# Patient Record
Sex: Female | Born: 1990 | Race: Black or African American | Hispanic: No | Marital: Married | Smoking: Never smoker
Health system: Southern US, Community
[De-identification: ages and names within clinical notes are randomized; demographics above are authoritative.]

## PROBLEM LIST (undated history)

## (undated) DIAGNOSIS — I499 Cardiac arrhythmia, unspecified: Secondary | ICD-10-CM

## (undated) DIAGNOSIS — E559 Vitamin D deficiency, unspecified: Secondary | ICD-10-CM

## (undated) DIAGNOSIS — A599 Trichomoniasis, unspecified: Secondary | ICD-10-CM

## (undated) HISTORY — DX: Vitamin D deficiency, unspecified: E55.9

## (undated) HISTORY — DX: Trichomoniasis, unspecified: A59.9

## (undated) HISTORY — DX: Cardiac arrhythmia, unspecified: I49.9

---

## 2012-08-01 HISTORY — PX: DILATION AND CURETTAGE OF UTERUS: SHX78

## 2020-02-06 ENCOUNTER — Encounter (HOSPITAL_COMMUNITY): Payer: Self-pay

## 2020-02-06 ENCOUNTER — Inpatient Hospital Stay (HOSPITAL_COMMUNITY): Payer: Medicaid Other

## 2020-02-06 ENCOUNTER — Encounter (HOSPITAL_COMMUNITY): Payer: Self-pay | Admitting: Obstetrics and Gynecology

## 2020-02-06 ENCOUNTER — Ambulatory Visit (HOSPITAL_COMMUNITY)
Admission: EM | Admit: 2020-02-06 | Discharge: 2020-02-06 | Disposition: A | Discharge status: Home / Self Care | Payer: Medicaid Other | Attending: Family Medicine | Admitting: Family Medicine

## 2020-02-06 ENCOUNTER — Inpatient Hospital Stay (HOSPITAL_COMMUNITY)
Admission: AD | Admit: 2020-02-06 | Discharge: 2020-02-06 | Disposition: A | Discharge status: Home / Self Care | Payer: Medicaid Other | Attending: Obstetrics and Gynecology | Admitting: Obstetrics and Gynecology

## 2020-02-06 ENCOUNTER — Other Ambulatory Visit: Payer: Self-pay

## 2020-02-06 DIAGNOSIS — Z3201 Encounter for pregnancy test, result positive: Secondary | ICD-10-CM | POA: Diagnosis not present

## 2020-02-06 DIAGNOSIS — O26891 Other specified pregnancy related conditions, first trimester: Secondary | ICD-10-CM | POA: Insufficient documentation

## 2020-02-06 DIAGNOSIS — Z3A10 10 weeks gestation of pregnancy: Secondary | ICD-10-CM | POA: Insufficient documentation

## 2020-02-06 DIAGNOSIS — A599 Trichomoniasis, unspecified: Secondary | ICD-10-CM

## 2020-02-06 DIAGNOSIS — N939 Abnormal uterine and vaginal bleeding, unspecified: Secondary | ICD-10-CM

## 2020-02-06 DIAGNOSIS — R8271 Bacteriuria: Secondary | ICD-10-CM | POA: Insufficient documentation

## 2020-02-06 DIAGNOSIS — O98311 Other infections with a predominantly sexual mode of transmission complicating pregnancy, first trimester: Secondary | ICD-10-CM | POA: Diagnosis not present

## 2020-02-06 DIAGNOSIS — O209 Hemorrhage in early pregnancy, unspecified: Secondary | ICD-10-CM

## 2020-02-06 DIAGNOSIS — Z3A09 9 weeks gestation of pregnancy: Secondary | ICD-10-CM

## 2020-02-06 LAB — WET PREP, GENITAL
Clue Cells Wet Prep HPF POC: NONE SEEN
Sperm: NONE SEEN
Yeast Wet Prep HPF POC: NONE SEEN

## 2020-02-06 LAB — URINALYSIS, ROUTINE W REFLEX MICROSCOPIC
Bilirubin Urine: NEGATIVE
Glucose, UA: NEGATIVE mg/dL
Ketones, ur: NEGATIVE mg/dL
Nitrite: NEGATIVE
Protein, ur: NEGATIVE mg/dL
Specific Gravity, Urine: 1.014 (ref 1.005–1.030)
pH: 6 (ref 5.0–8.0)

## 2020-02-06 LAB — POCT URINALYSIS DIP (DEVICE)
Bilirubin Urine: NEGATIVE
Glucose, UA: NEGATIVE mg/dL
Ketones, ur: NEGATIVE mg/dL
Nitrite: NEGATIVE
Protein, ur: NEGATIVE mg/dL
Specific Gravity, Urine: 1.025 (ref 1.005–1.030)
Urobilinogen, UA: 0.2 mg/dL (ref 0.0–1.0)
pH: 6.5 (ref 5.0–8.0)

## 2020-02-06 LAB — HCG, QUANTITATIVE, PREGNANCY: hCG, Beta Chain, Quant, S: 236386 m[IU]/mL — ABNORMAL HIGH (ref <5)

## 2020-02-06 LAB — CBC
HCT: 37.1 % (ref 36.0–46.0)
Hemoglobin: 12.3 g/dL (ref 12.0–15.0)
MCH: 27.8 pg (ref 26.0–34.0)
MCHC: 33.2 g/dL (ref 30.0–36.0)
MCV: 83.7 fL (ref 80.0–100.0)
Platelets: 396 10*3/uL (ref 150–400)
RBC: 4.43 MIL/uL (ref 3.87–5.11)
RDW: 13.7 % (ref 11.5–15.5)
WBC: 6.7 10*3/uL (ref 4.0–10.5)
nRBC: 0 % (ref 0.0–0.2)

## 2020-02-06 LAB — POC URINE PREG, ED: Preg Test, Ur: POSITIVE — AB

## 2020-02-06 LAB — ABO/RH: ABO/RH(D): A POS

## 2020-02-06 MED ORDER — METRONIDAZOLE 500 MG PO TABS
2000.0000 mg | ORAL_TABLET | Freq: Once | ORAL | Status: AC
  Administered 2020-02-06: 2000 mg via ORAL
  Filled 2020-02-06: qty 4

## 2020-02-06 NOTE — MAU Note (Addendum)
Pt reports that she was having a BM @ 1030 last night and a spot of blood was on the tissue when she wiped.  Again this am around 0530 went to the bathroom, wiped again, and there was bloody show on the tissue.  Last intercourse was 4 days ago.  First prenatal appt scheduled for 02/18/20 at [facility].

## 2020-02-06 NOTE — ED Triage Notes (Signed)
Pt reports being [redacted]wks pregnant and began having some vaginal spotting last night around 1030pm. Pt reports after having BM, she noticed the blood. No bleeding noted today.

## 2020-02-06 NOTE — Discharge Instructions (Signed)
First Trimester of Pregnancy  The first trimester of pregnancy is from week 1 until the end of week 13 (months 1 through 3). During this time, your baby will begin to develop inside you. At 6-8 weeks, the eyes and face are formed, and the heartbeat can be seen on ultrasound. At the end of 12 weeks, all the baby's organs are formed. Prenatal care is all the medical care you receive before the birth of your baby. Make sure you get good prenatal care and follow all of your doctor's instructions. Follow these instructions at home: Medicines  Take over-the-counter and prescription medicines only as told by your doctor. Some medicines are safe and some medicines are not safe during pregnancy.  Take a prenatal vitamin that contains at least 600 micrograms (mcg) of folic acid.  If you have trouble pooping (constipation), take medicine that will make your stool soft (stool softener) if your doctor approves. Eating and drinking   Eat regular, healthy meals.  Your doctor will tell you the amount of weight gain that is right for you.  Avoid raw meat and uncooked cheese.  If you feel sick to your stomach (nauseous) or throw up (vomit): ? Eat 4 or 5 small meals a day instead of 3 large meals. ? Try eating a few soda crackers. ? Drink liquids between meals instead of during meals.  To prevent constipation: ? Eat foods that are high in fiber, like fresh fruits and vegetables, whole grains, and beans. ? Drink enough fluids to keep your pee (urine) clear or pale yellow. Activity  Exercise only as told by your doctor. Stop exercising if you have cramps or pain in your lower belly (abdomen) or low back.  Do not exercise if it is too hot, too humid, or if you are in a place of great height (high altitude).  Try to avoid standing for long periods of time. Move your legs often if you must stand in one place for a long time.  Avoid heavy lifting.  Wear low-heeled shoes. Sit and stand up  straight.  You can have sex unless your doctor tells you not to. Relieving pain and discomfort  Wear a good support bra if your breasts are sore.  Take warm water baths (sitz baths) to soothe pain or discomfort caused by hemorrhoids. Use hemorrhoid cream if your doctor says it is okay.  Rest with your legs raised if you have leg cramps or low back pain.  If you have puffy, bulging veins (varicose veins) in your legs: ? Wear support hose or compression stockings as told by your doctor. ? Raise (elevate) your feet for 15 minutes, 3-4 times a day. ? Limit salt in your food. Prenatal care  Schedule your prenatal visits by the twelfth week of pregnancy.  Write down your questions. Take them to your prenatal visits.  Keep all your prenatal visits as told by your doctor. This is important. Safety  Wear your seat belt at all times when driving.  Make a list of emergency phone numbers. The list should include numbers for family, friends, the hospital, and police and fire departments. General instructions  Ask your doctor for a referral to a local prenatal class. Begin classes no later than at the start of month 6 of your pregnancy.  Ask for help if you need counseling or if you need help with nutrition. Your doctor can give you advice or tell you where to go for help.  Do not use hot tubs, steam   rooms, or saunas.  Do not douche or use tampons or scented sanitary pads.  Do not cross your legs for long periods of time.  Avoid all herbs and alcohol. Avoid drugs that are not approved by your doctor.  Do not use any tobacco products, including cigarettes, chewing tobacco, and electronic cigarettes. If you need help quitting, ask your doctor. You may get counseling or other support to help you quit.  Avoid cat litter boxes and soil used by cats. These carry germs that can cause birth defects in the baby and can cause a loss of your baby (miscarriage) or stillbirth.  Visit your dentist.  At home, brush your teeth with a soft toothbrush. Be gentle when you floss. Contact a doctor if:  You are dizzy.  You have mild cramps or pressure in your lower belly.  You have a nagging pain in your belly area.  You continue to feel sick to your stomach, you throw up, or you have watery poop (diarrhea).  You have a bad smelling fluid coming from your vagina.  You have pain when you pee (urinate).  You have increased puffiness (swelling) in your face, hands, legs, or ankles. Get help right away if:  You have a fever.  You are leaking fluid from your vagina.  You have spotting or bleeding from your vagina.  You have very bad belly cramping or pain.  You gain or lose weight rapidly.  You throw up blood. It may look like coffee grounds.  You are around people who have German measles, fifth disease, or chickenpox.  You have a very bad headache.  You have shortness of breath.  You have any kind of trauma, such as from a fall or a car accident. Summary  The first trimester of pregnancy is from week 1 until the end of week 13 (months 1 through 3).  To take care of yourself and your unborn baby, you will need to eat healthy meals, take medicines only if your doctor tells you to do so, and do activities that are safe for you and your baby.  Keep all follow-up visits as told by your doctor. This is important as your doctor will have to ensure that your baby is healthy and growing well. This information is not intended to replace advice given to you by your health care provider. Make sure you discuss any questions you have with your health care provider. Document Revised: 11/08/2018 Document Reviewed: 07/26/2016 Elsevier Patient Education  2020 Elsevier Inc.  

## 2020-02-06 NOTE — ED Provider Notes (Signed)
[facility]   MRN: [id number] DOB: 08-31-1990  Subjective:   Cindy Hughes is a 29 y.o. female presenting for 1 day hx of light vaginal bleeding.  Still noticed some spotting this morning.  Patient is [redacted] weeks pregnant.  Denies fever, abdominal pain, pelvic pain, dysuria, urinary frequency.  Patient would like to make sure her pregnancy is safe.  No current facility-administered medications for this encounter.  Current Outpatient Medications:    Prenatal Vit-Fe Fumarate-FA (MULTIVITAMIN-PRENATAL) 27-0.8 MG TABS tablet, Take 1 tablet by mouth daily at 12 noon., Disp: , Rfl:    Not on File  History reviewed. No pertinent past medical history.   History reviewed. No pertinent surgical history.  History reviewed. No pertinent family history.  Social History   Tobacco Use   Smoking status: Never Smoker   Smokeless tobacco: Never Used  Substance Use Topics   Alcohol use: Never   Drug use: Never    ROS   Objective:   Vitals: BP 126/81 (BP Location: Left Arm)    Pulse 79    Temp 98.3 F (36.8 C) (Oral)    Resp 16    Ht 5\' 3"  (1.6 m)    Wt 250 lb (113.4 kg)    LMP 12/06/2019    SpO2 100%    BMI 44.29 kg/m   Physical Exam Constitutional:      General: She is not in acute distress.    Appearance: Normal appearance. She is well-developed. She is obese. She is not ill-appearing, toxic-appearing or diaphoretic.  HENT:     Head: Normocephalic and atraumatic.     Nose: Nose normal.     Mouth/Throat:     Mouth: Mucous membranes are moist.     Pharynx: Oropharynx is clear.  Eyes:     General: No scleral icterus.       Right eye: No discharge.        Left eye: No discharge.     Extraocular Movements: Extraocular movements intact.     Conjunctiva/sclera: Conjunctivae normal.     Pupils: Pupils are equal, round, and reactive to light.  Cardiovascular:     Rate and Rhythm: Normal rate.  Pulmonary:     Effort: Pulmonary effort is normal.  Skin:     General: Skin is warm and dry.  Neurological:     General: No focal deficit present.     Mental Status: She is alert and oriented to person, place, and time.  Psychiatric:        Mood and Affect: Mood normal.        Behavior: Behavior normal.        Thought Content: Thought content normal.        Judgment: Judgment normal.     Results for orders placed or performed during the hospital encounter of 02/06/20 (from the past 24 hour(s))  POC urine pregnancy     Status: Abnormal   Collection Time: 02/06/20  9:54 AM  Result Value Ref Range   Preg Test, Ur POSITIVE (A) NEGATIVE  POCT urinalysis dip (device)     Status: Abnormal   Collection Time: 02/06/20 10:16 AM  Result Value Ref Range   Glucose, UA NEGATIVE NEGATIVE mg/dL   Bilirubin Urine NEGATIVE NEGATIVE   Ketones, ur NEGATIVE NEGATIVE mg/dL   Specific Gravity, Urine 1.025 1.005 - 1.030   Hgb urine dipstick SMALL (A) NEGATIVE   pH 6.5 5.0 - 8.0   Protein, ur NEGATIVE NEGATIVE mg/dL   Urobilinogen, UA  0.2 0.0 - 1.0 mg/dL   Nitrite NEGATIVE NEGATIVE   Leukocytes,Ua LARGE (A) NEGATIVE    Assessment and Plan :   PDMP not reviewed this encounter.  1. Vaginal bleeding   2. [redacted] weeks gestation of pregnancy     Discussed urinalysis with patient including the small hematuria, large leukocytes.  In the absence of symptoms consistent with an UTI and patient's concern for her pregnancy recommended evaluation in the maternal admission unit.  Patient is agreeable to present there now.   Wallis Bamberg, PA-C 02/06/20 1020

## 2020-02-06 NOTE — MAU Provider Note (Addendum)
History     CSN: [id number]  Arrival date and time: 02/06/20 1032   First Provider Initiated Contact with Patient 02/06/20 1139      Chief Complaint  Patient presents with  . Vaginal Bleeding   HPI Cindy Hughes is a 29 y.o. [phone] at [redacted]w[redacted]d who presents to MAU with chief complaint of vaginal bleeding. This is a new problem, onset last night. Patient states she had a bowel movement and noticed bleeding when she wiped. She then observed pink-tinged discharge when she wiped after voiding this morning. She denies abdominal pain, tenderness, dysuria, fever or recent illness. Most recent sexual intercourse 3-4 days ago.  Patient is scheduled for a New OB appointment with [facility] on 02/18/2020.  OB History    Gravida  4   Para  1   Term  1   Preterm      AB  2   Living  1     SAB  1   TAB  1   Ectopic      Multiple      Live Births  1           History reviewed. No pertinent past medical history.  Past Surgical History:  Procedure Laterality Date  . DILATION AND CURETTAGE OF UTERUS  2014    Family History  Adopted: Yes    Social History   Tobacco Use  . Smoking status: Never Smoker  . Smokeless tobacco: Never Used  Vaping Use  . Vaping Use: Never used  Substance Use Topics  . Alcohol use: Never  . Drug use: Never    Allergies: No Known Allergies  No medications prior to admission.    Review of Systems  Gastrointestinal: Negative for abdominal pain.  Genitourinary: Positive for vaginal bleeding. Negative for dysuria.  Musculoskeletal: Negative for back pain.  All other systems reviewed and are negative.  Physical Exam   Blood pressure 132/83, pulse 92, temperature 98.3 F (36.8 C), temperature source Oral, resp. rate 18, weight 113.4 kg, last menstrual period 12/06/2019, SpO2 100 %.  Physical Exam Vitals and nursing note reviewed. Exam conducted with a chaperone present.  Cardiovascular:     Rate and Rhythm: Normal rate.      Pulses: Normal pulses.     Heart sounds: Normal heart sounds.  Pulmonary:     Effort: Pulmonary effort is normal.     Breath sounds: Normal breath sounds.  Abdominal:     General: Bowel sounds are normal.  Genitourinary:    Comments: Pelvic exam: External genitalia normal, vaginal walls pink and well rugated, cervix visually closed, no lesions noted. Small amount of green-tinged mucus proximal to cervical os. No bleeding noted. No CMT on bimanual. No hemorrhoids observed.  Neurological:     Mental Status: She is alert.     MAU Course/MDM  Procedures    Patient Vitals for the past 24 hrs:  BP Temp Temp src Pulse Resp SpO2 Weight  02/06/20 1358 132/83 98.3 F (36.8 C) Oral 92 18 100 % --  02/06/20 1119 130/68 98.4 F (36.9 C) Oral 74 18 98 % 113.4 kg   Orders Placed This Encounter  Procedures  . Wet prep, genital  . US OB LESS THAN 14 WEEKS WITH OB TRANSVAGINAL  . CBC  . hCG, quantitative, pregnancy  . Urinalysis, Routine w reflex microscopic  . ABO/Rh   Meds ordered this encounter  Medications  . metroNIDAZOLE (FLAGYL) tablet 2,000 mg   Results for orders placed  or performed during the hospital encounter of 02/06/20 (from the past 24 hour(s))  CBC     Status: None   Collection Time: 02/06/20 11:09 AM  Result Value Ref Range   WBC 6.7 4.0 - 10.5 K/uL   RBC 4.43 3.87 - 5.11 MIL/uL   Hemoglobin 12.3 12.0 - 15.0 g/dL   HCT 40.9 36 - 46 %   MCV 83.7 80.0 - 100.0 fL   MCH 27.8 26.0 - 34.0 pg   MCHC 33.2 30.0 - 36.0 g/dL   RDW 81.1 91.4 - 78.2 %   Platelets 396 150 - 400 K/uL   nRBC 0.0 0.0 - 0.2 %  hCG, quantitative, pregnancy     Status: Abnormal   Collection Time: 02/06/20 11:09 AM  Result Value Ref Range   hCG, Beta Chain, Quant, S 956,213 (H) <5 mIU/mL  ABO/Rh     Status: None   Collection Time: 02/06/20 11:09 AM  Result Value Ref Range   ABO/RH(D) A POS    No rh immune globuloin      NOT A RH IMMUNE GLOBULIN CANDIDATE, PT RH POSITIVE Performed at [facility], [address], [place], [place] [address]   Urinalysis, Routine w reflex microscopic     Status: Abnormal   Collection Time: 02/06/20 11:41 AM  Result Value Ref Range   Color, Urine YELLOW YELLOW   APPearance CLOUDY (A) CLEAR   Specific Gravity, Urine 1.014 1.005 - 1.030   pH 6.0 5.0 - 8.0   Glucose, UA NEGATIVE NEGATIVE mg/dL   Hgb urine dipstick SMALL (A) NEGATIVE   Bilirubin Urine NEGATIVE NEGATIVE   Ketones, ur NEGATIVE NEGATIVE mg/dL   Protein, ur NEGATIVE NEGATIVE mg/dL   Nitrite NEGATIVE NEGATIVE   Leukocytes,Ua LARGE (A) NEGATIVE   RBC / HPF 21-50 0 - 5 RBC/hpf   WBC, UA 21-50 0 - 5 WBC/hpf   Bacteria, UA MANY (A) NONE SEEN   Squamous Epithelial / LPF 11-20 0 - 5  Wet prep, genital     Status: Abnormal   Collection Time: 02/06/20 11:47 AM   Specimen: Vaginal  Result Value Ref Range   Yeast Wet Prep HPF POC NONE SEEN NONE SEEN   Trich, Wet Prep PRESENT (A) NONE SEEN   Clue Cells Wet Prep HPF POC NONE SEEN NONE SEEN   WBC, Wet Prep HPF POC MANY (A) NONE SEEN   Sperm NONE SEEN     US OB LESS THAN 14 WEEKS WITH OB TRANSVAGINAL  Result Date: 02/06/2020 CLINICAL DATA:  Vaginal bleeding in first trimester of pregnancy, spotting last night, LMP 11/30/2019 EXAM: OBSTETRIC <14 WK [place] AND TRANSVAGINAL OB US TECHNIQUE: Both transabdominal and transvaginal ultrasound examinations were performed for complete evaluation of the gestation as well as the maternal uterus, adnexal regions, and pelvic cul-de-sac. Transvaginal technique was performed to assess early pregnancy. COMPARISON:  None FINDINGS: Intrauterine gestational sac: Present, single Yolk sac:  Present Embryo:  Present Cardiac Activity: Present Heart Rate: 160 bpm CRL:  32.0 mm   10 w   1 d                  [place] EDC: 09/02/2020 Subchorionic hemorrhage:  None visualized. Maternal uterus/adnexae: Uterus anteverted, grossly unremarkable. Developing placenta appears posterior RIGHT. LEFT ovary normal size and morphology 3.8 x 1.9  x 2.4 cm. RIGHT ovary normal size and morphology 2.2 x 1.2 x 1.4 cm. No free pelvic fluid or adnexal masses. IMPRESSION: Single live intrauterine gestation at  10 weeks 1 day EGA by crown-rump length. No acute abnormalities. Electronically Signed   By: Ulyses Southward M.D.   On: 02/06/2020 12:54   Assessment and Plan  --29 y.o. G4P1021 at [redacted]w[redacted]d by US performed today --Abnormal UA, urine culture ordered --Blood type A POS, Rhogam not indicated --+ Trichomonas, treated in MAU --Given paper rx for expedited partner treatment --Reviewed recommendations for close proximity treatment for both partners, abstinence until 7 days s/p treatment, condom use for remainder of pregnancy --Discharge home in stable condition  F/U: --Has New OB appt with [facility] 02/18/2020  Calvert Cantor, CNM 02/06/2020, 4:10 PM

## 2020-02-07 LAB — GC/CHLAMYDIA PROBE AMP (~~LOC~~) NOT AT ARMC
Chlamydia: NEGATIVE
Comment: NEGATIVE
Comment: NORMAL
Neisseria Gonorrhea: NEGATIVE

## 2020-02-18 LAB — OB RESULTS CONSOLE GC/CHLAMYDIA: Gonorrhea: NEGATIVE

## 2020-02-18 LAB — OB RESULTS CONSOLE HEPATITIS B SURFACE ANTIGEN: Hepatitis B Surface Ag: NEGATIVE

## 2020-02-18 LAB — OB RESULTS CONSOLE HIV ANTIBODY (ROUTINE TESTING): HIV: NONREACTIVE

## 2020-02-18 LAB — OB RESULTS CONSOLE RUBELLA ANTIBODY, IGM: Rubella: IMMUNE

## 2020-03-20 ENCOUNTER — Emergency Department (HOSPITAL_COMMUNITY): Payer: Medicaid Other

## 2020-03-20 ENCOUNTER — Encounter (HOSPITAL_COMMUNITY): Payer: Self-pay | Admitting: Emergency Medicine

## 2020-03-20 ENCOUNTER — Other Ambulatory Visit: Payer: Self-pay

## 2020-03-20 ENCOUNTER — Emergency Department (HOSPITAL_COMMUNITY)
Admission: EM | Admit: 2020-03-20 | Discharge: 2020-03-20 | Disposition: A | Discharge status: Home / Self Care | Payer: Medicaid Other | Attending: Emergency Medicine | Admitting: Emergency Medicine

## 2020-03-20 DIAGNOSIS — W19XXXA Unspecified fall, initial encounter: Secondary | ICD-10-CM

## 2020-03-20 DIAGNOSIS — W1842XA Slipping, tripping and stumbling without falling due to stepping into hole or opening, initial encounter: Secondary | ICD-10-CM | POA: Diagnosis not present

## 2020-03-20 DIAGNOSIS — S93602A Unspecified sprain of left foot, initial encounter: Secondary | ICD-10-CM | POA: Diagnosis not present

## 2020-03-20 DIAGNOSIS — Y929 Unspecified place or not applicable: Secondary | ICD-10-CM | POA: Diagnosis not present

## 2020-03-20 DIAGNOSIS — Y999 Unspecified external cause status: Secondary | ICD-10-CM | POA: Insufficient documentation

## 2020-03-20 DIAGNOSIS — Y939 Activity, unspecified: Secondary | ICD-10-CM | POA: Insufficient documentation

## 2020-03-20 DIAGNOSIS — S99922A Unspecified injury of left foot, initial encounter: Secondary | ICD-10-CM | POA: Diagnosis present

## 2020-03-20 NOTE — Progress Notes (Signed)
Orthopedic Tech Progress Note Patient Details:  Cindy Hughes 1991-07-20 [id number]  Ortho Devices Type of Ortho Device: Crutches Ortho Device/Splint Interventions: Application   Post Interventions Patient Tolerated: Well, Ambulated well   Saul Fordyce 03/20/2020, 1:26 PM

## 2020-03-20 NOTE — ED Provider Notes (Signed)
[facility]-EMERGENCY DEPT Provider Note   CSN: [id number] Arrival date & time: 03/20/20  0901     History Chief Complaint  Patient presents with  . Foot Pain    Cindy Hughes is a 29 y.o. female.  Pain in left foot after rolling it yesterday on a storm drain.  Has been ambulatory with pain to the top of her left foot.  No ankle pain or knee pain.  The history is provided by the patient.  Foot Pain This is a new problem. The current episode started yesterday. The problem occurs constantly. The problem has not changed since onset.The symptoms are aggravated by walking. Nothing relieves the symptoms. She has tried nothing for the symptoms. The treatment provided no relief.       History reviewed. No pertinent past medical history.  There are no problems to display for this patient.   Past Surgical History:  Procedure Laterality Date  . DILATION AND CURETTAGE OF UTERUS  2014     OB History    Gravida  4   Para  1   Term  1   Preterm      AB  2   Living  1     SAB  1   TAB  1   Ectopic      Multiple      Live Births  1           Family History  Adopted: Yes    Social History   Tobacco Use  . Smoking status: Never Smoker  . Smokeless tobacco: Never Used  Vaping Use  . Vaping Use: Never used  Substance Use Topics  . Alcohol use: Never  . Drug use: Never    Home Medications Prior to Admission medications   Medication Sig Start Date End Date Taking? Authorizing Provider  Prenatal Vit-Fe Fumarate-FA (MULTIVITAMIN-PRENATAL) 27-0.8 MG TABS tablet Take 1 tablet by mouth daily at 12 noon.    [provider]    Allergies    Patient has no known allergies.  Review of Systems   Review of Systems  Musculoskeletal: Positive for arthralgias. Negative for joint swelling.  Skin: Negative for color change, pallor, rash and wound.    Physical Exam Updated Vital Signs  ED Triage Vitals  Enc Vitals Group      BP 03/20/20 0914 139/81     Pulse Rate 03/20/20 0914 91     Resp 03/20/20 0914 18     Temp 03/20/20 0914 98.2 F (36.8 C)     Temp Source 03/20/20 0914 Oral     SpO2 03/20/20 0914 96 %     Weight --      Height --      Head Circumference --      Peak Flow --      Pain Score 03/20/20 0917 7     Pain Loc --      Pain Edu? --      Excl. in GC? --     Physical Exam Constitutional:      General: She is not in acute distress.    Appearance: She is not ill-appearing.  Cardiovascular:     Pulses: Normal pulses.  Musculoskeletal:        General: Tenderness (TTP to lateral portion of the left foot) present. No swelling or deformity. Normal range of motion.  Skin:    General: Skin is warm.     Capillary Refill: Capillary refill takes less than  2 seconds.  Neurological:     Mental Status: She is alert.     Sensory: No sensory deficit.     Motor: No weakness.     ED Results / Procedures / Treatments   Labs (all labs ordered are listed, but only abnormal results are displayed) Labs Reviewed - No data to display  EKG None  Radiology DG Foot Complete Left  Result Date: 03/20/2020 CLINICAL DATA:  Dorsal and lateral left foot pain after twisting injury EXAM: LEFT FOOT - COMPLETE 3+ VIEW COMPARISON:  None. FINDINGS: There is no evidence of fracture or dislocation. There is no evidence of arthropathy or other focal bone abnormality. Soft tissues are unremarkable. IMPRESSION: Negative. Electronically Signed   By: Duanne Guess D.O.   On: 03/20/2020 09:42    Procedures Procedures (including critical care time)  Medications Ordered in ED Medications - No data to display  ED Course  I have reviewed the triage vital signs and the nursing notes.  Pertinent labs & imaging results that were available during my care of the patient were reviewed by me and considered in my medical decision making (see chart for details).    MDM Rules/Calculators/A&P                           Cindy Hughes is a 29 year old female who presents to the ED with left foot pain.  Patient with normal vitals.  No fever.  Patient twisted her left foot yesterday on a storm drain.  No ankle pain.  Has been ambulatory since.  Mostly tender to the top of the lateral portion of the left foot.  Neurovascularly neuromuscularly intact.  X-ray negative for fracture.  Overall suspect bone contusion.  Recommend Tylenol, Motrin, ice.  Given crutches.  Discharged in good condition.  Understands return precautions.  This chart was dictated using voice recognition software.  Despite best efforts to proofread,  errors can occur which can change the documentation meaning.    Final Clinical Impression(s) / ED Diagnoses Final diagnoses:  Fall  Sprain of left foot, initial encounter    Rx / DC Orders ED Discharge Orders    None       Virgina Norfolk, DO 03/20/20 1322

## 2020-03-20 NOTE — ED Triage Notes (Signed)
Per pt, states she fell yesterday injuring top of left foot-increased pain with ambulation

## 2020-05-31 LAB — OB RESULTS CONSOLE GC/CHLAMYDIA
Chlamydia: NEGATIVE
Gonorrhea: NEGATIVE

## 2020-08-01 NOTE — L&D Delivery Note (Signed)
Delivery Note Labor onset:09/02/20   Labor Onset Time: last night Complete dilation at  1210 Onset of pushing at 1210 FHR second stage Cat 2 Analgesia/Anesthesia intrapartum: N/A  Guided pushing with maternal urge. Delivery of a viable female at 92. Fetal head delivered in ROA position. Nuchal cord x1, reduced. Infant placed on maternal abd, dried, and tactile stimulated with spontaneous cry. Cord double clamped after pulsation stopped and cut by FOB. Nsy present for birth. Delivered in MAU d/t no available beds on L&D. Cord blood sample collected.  Placenta delivered Tomasa Blase via Binnie Kand Maneuver, intact, with 3 VC.  Placenta to L&D. Uterine tone firm,  bleeding minimal  Right labial laceration identified, hemostatic Anesthesia: N/A Repair: none QBL/EBL (mL): 50 Complications: nuchal cord x 1 APGAR: APGAR (1 MIN):   APGAR (5 MINS):   APGAR (10 MINS):   Mom to postpartum.  Baby to Couplet care / Skin to Skin  Dr Philippa Sicks aware of delivery.  Gerhard Munch Dorrine Montone MSN, CNM 09/03/2020, 12:54 PM

## 2020-08-19 LAB — OB RESULTS CONSOLE GBS: GBS: NEGATIVE

## 2020-08-26 ENCOUNTER — Other Ambulatory Visit: Payer: Self-pay | Admitting: Obstetrics & Gynecology

## 2020-09-03 ENCOUNTER — Inpatient Hospital Stay (HOSPITAL_COMMUNITY)
Admission: AD | Admit: 2020-09-03 | Discharge: 2020-09-05 | DRG: 807 | Disposition: A | Discharge status: Home / Self Care | Payer: Medicaid Other | Attending: Obstetrics & Gynecology | Admitting: Obstetrics & Gynecology

## 2020-09-03 ENCOUNTER — Other Ambulatory Visit: Payer: Self-pay

## 2020-09-03 ENCOUNTER — Encounter (HOSPITAL_COMMUNITY): Payer: Self-pay | Admitting: Obstetrics & Gynecology

## 2020-09-03 ENCOUNTER — Telehealth (HOSPITAL_COMMUNITY): Payer: Self-pay | Admitting: *Deleted

## 2020-09-03 DIAGNOSIS — O365930 Maternal care for other known or suspected poor fetal growth, third trimester, not applicable or unspecified: Principal | ICD-10-CM | POA: Diagnosis present

## 2020-09-03 DIAGNOSIS — O99214 Obesity complicating childbirth: Secondary | ICD-10-CM | POA: Diagnosis present

## 2020-09-03 DIAGNOSIS — Z3A40 40 weeks gestation of pregnancy: Secondary | ICD-10-CM | POA: Diagnosis not present

## 2020-09-03 DIAGNOSIS — O26893 Other specified pregnancy related conditions, third trimester: Secondary | ICD-10-CM | POA: Diagnosis present

## 2020-09-03 DIAGNOSIS — Z20822 Contact with and (suspected) exposure to covid-19: Secondary | ICD-10-CM | POA: Diagnosis present

## 2020-09-03 LAB — CBC
HCT: 31.4 % — ABNORMAL LOW (ref 36.0–46.0)
Hemoglobin: 10.2 g/dL — ABNORMAL LOW (ref 12.0–15.0)
MCH: 28.4 pg (ref 26.0–34.0)
MCHC: 32.5 g/dL (ref 30.0–36.0)
MCV: 87.5 fL (ref 80.0–100.0)
Platelets: 357 10*3/uL (ref 150–400)
RBC: 3.59 MIL/uL — ABNORMAL LOW (ref 3.87–5.11)
RDW: 16.9 % — ABNORMAL HIGH (ref 11.5–15.5)
WBC: 11.2 10*3/uL — ABNORMAL HIGH (ref 4.0–10.5)
nRBC: 0 % (ref 0.0–0.2)

## 2020-09-03 LAB — TYPE AND SCREEN
ABO/RH(D): A POS
Antibody Screen: NEGATIVE

## 2020-09-03 LAB — SARS CORONAVIRUS 2 BY RT PCR (HOSPITAL ORDER, PERFORMED IN ~~LOC~~ HOSPITAL LAB): SARS Coronavirus 2: NEGATIVE

## 2020-09-03 MED ORDER — ACETAMINOPHEN 325 MG PO TABS: 650.0000 mg | ORAL_TABLET | ORAL | Status: DC | PRN

## 2020-09-03 MED ORDER — LACTATED RINGERS IV SOLN: INTRAVENOUS | Status: DC

## 2020-09-03 MED ORDER — FENTANYL CITRATE (PF) 100 MCG/2ML IJ SOLN
100.0000 ug | Freq: Once | INTRAMUSCULAR | Status: AC
  Administered 2020-09-03: 100 ug via INTRAVENOUS
  Filled 2020-09-03: qty 2

## 2020-09-03 MED ORDER — SENNOSIDES-DOCUSATE SODIUM 8.6-50 MG PO TABS
2.0000 | ORAL_TABLET | Freq: Every day | ORAL | Status: DC
  Administered 2020-09-04 – 2020-09-05 (×2): 2 via ORAL
  Filled 2020-09-03: qty 2

## 2020-09-03 MED ORDER — LIDOCAINE HCL (PF) 1 % IJ SOLN: 30.0000 mL | INTRAMUSCULAR | Status: DC | PRN

## 2020-09-03 MED ORDER — ONDANSETRON HCL 4 MG PO TABS: 4.0000 mg | ORAL_TABLET | ORAL | Status: DC | PRN

## 2020-09-03 MED ORDER — ACETAMINOPHEN 325 MG PO TABS
650.0000 mg | ORAL_TABLET | ORAL | Status: DC | PRN
  Administered 2020-09-03: 650 mg via ORAL
  Filled 2020-09-03: qty 2

## 2020-09-03 MED ORDER — ERYTHROMYCIN 5 MG/GM OP OINT
TOPICAL_OINTMENT | OPHTHALMIC | Status: AC
  Filled 2020-09-03: qty 1

## 2020-09-03 MED ORDER — LACTATED RINGERS IV SOLN: 500.0000 mL | INTRAVENOUS | Status: DC | PRN

## 2020-09-03 MED ORDER — OXYCODONE HCL 5 MG PO TABS
10.0000 mg | ORAL_TABLET | ORAL | Status: DC | PRN
Start: 2020-09-03 — End: 2020-09-05

## 2020-09-03 MED ORDER — OXYTOCIN BOLUS FROM INFUSION: 333.0000 mL | Freq: Once | INTRAVENOUS | Status: DC

## 2020-09-03 MED ORDER — FENTANYL CITRATE (PF) 100 MCG/2ML IJ SOLN: 50.0000 ug | INTRAMUSCULAR | Status: DC | PRN

## 2020-09-03 MED ORDER — PRENATAL MULTIVITAMIN CH
1.0000 | ORAL_TABLET | Freq: Every day | ORAL | Status: DC
  Administered 2020-09-04 – 2020-09-05 (×2): 1 via ORAL
  Filled 2020-09-03 (×2): qty 1

## 2020-09-03 MED ORDER — ONDANSETRON HCL 4 MG/2ML IJ SOLN: 4.0000 mg | INTRAMUSCULAR | Status: DC | PRN

## 2020-09-03 MED ORDER — SIMETHICONE 80 MG PO CHEW: 80.0000 mg | CHEWABLE_TABLET | ORAL | Status: DC | PRN

## 2020-09-03 MED ORDER — DIPHENHYDRAMINE HCL 25 MG PO CAPS: 25.0000 mg | ORAL_CAPSULE | Freq: Four times a day (QID) | ORAL | Status: DC | PRN

## 2020-09-03 MED ORDER — IBUPROFEN 600 MG PO TABS
600.0000 mg | ORAL_TABLET | Freq: Four times a day (QID) | ORAL | Status: DC
  Administered 2020-09-03 – 2020-09-05 (×8): 600 mg via ORAL
  Filled 2020-09-03 (×8): qty 1

## 2020-09-03 MED ORDER — OXYTOCIN 10 UNIT/ML IJ SOLN
10.0000 [IU] | Freq: Once | INTRAMUSCULAR | Status: AC
  Administered 2020-09-03: 10 [IU] via INTRAMUSCULAR

## 2020-09-03 MED ORDER — VITAMIN K1 1 MG/0.5ML IJ SOLN
INTRAMUSCULAR | Status: AC
  Filled 2020-09-03: qty 0.5

## 2020-09-03 MED ORDER — DIBUCAINE (PERIANAL) 1 % EX OINT: 1.0000 "application " | TOPICAL_OINTMENT | CUTANEOUS | Status: DC | PRN

## 2020-09-03 MED ORDER — COCONUT OIL OIL: 1.0000 "application " | TOPICAL_OIL | Status: DC | PRN

## 2020-09-03 MED ORDER — ZOLPIDEM TARTRATE 5 MG PO TABS: 5.0000 mg | ORAL_TABLET | Freq: Every evening | ORAL | Status: DC | PRN

## 2020-09-03 MED ORDER — WITCH HAZEL-GLYCERIN EX PADS: 1.0000 "application " | MEDICATED_PAD | CUTANEOUS | Status: DC | PRN

## 2020-09-03 MED ORDER — OXYCODONE HCL 5 MG PO TABS
5.0000 mg | ORAL_TABLET | ORAL | Status: DC | PRN
Start: 2020-09-03 — End: 2020-09-05

## 2020-09-03 MED ORDER — OXYTOCIN-SODIUM CHLORIDE 30-0.9 UT/500ML-% IV SOLN
2.5000 [IU]/h | INTRAVENOUS | Status: DC
  Filled 2020-09-03: qty 500

## 2020-09-03 MED ORDER — BENZOCAINE-MENTHOL 20-0.5 % EX AERO: 1.0000 "application " | INHALATION_SPRAY | CUTANEOUS | Status: DC | PRN

## 2020-09-03 MED ORDER — ONDANSETRON HCL 4 MG/2ML IJ SOLN: 4.0000 mg | Freq: Four times a day (QID) | INTRAMUSCULAR | Status: DC | PRN

## 2020-09-03 MED ORDER — TETANUS-DIPHTH-ACELL PERTUSSIS 5-2.5-18.5 LF-MCG/0.5 IM SUSY: 0.5000 mL | PREFILLED_SYRINGE | Freq: Once | INTRAMUSCULAR | Status: DC

## 2020-09-03 MED ORDER — SOD CITRATE-CITRIC ACID 500-334 MG/5ML PO SOLN: 30.0000 mL | ORAL | Status: DC | PRN

## 2020-09-03 NOTE — Telephone Encounter (Signed)
Preadmission screen  

## 2020-09-03 NOTE — H&P (Signed)
OB ADMISSION/ HISTORY & PHYSICAL:  Admission Date: 09/03/2020  9:38 AM  Admit Diagnosis: spontaneous labor  Cindy Hughes is a 30 y.o. female [phone] [redacted]w[redacted]d presenting for active labor. Endorses active FM, denies LOF and vaginal bleeding. Ctx began last night.   History of current pregnancy: [id number]   Primary OB Provider: [facility] Patient entered care with [facility] at 10.1 wks.   EDC 09/08/20 by LMP and congruent w/ 10.1 wk U/S.   Anatomy scan:  20 wks, complete w/ posterior placenta.   Antenatal testing: for BMI >40 started at 32 weeks Last evaluation: 39.1  wks  Significant prenatal events: SGA, obesity  Prenatal Labs: ABO, Rh: --/--/A POS (07/08 1109) Antibody:  negative Rubella: Immune (07/20 0000)  RPR:   NR HBsAg: Negative (07/20 0000)  HIV: Non-reactive (07/20 0000)  GTT: 79 GBS: Negative/-- (01/19 0000)  GC/CHL: negative/negative Genetics: Panorama low risk female, Horizon negative Tdap/influenza vaccines: UTD/UTD   OB History  Gravida Para Term Preterm AB Living  4 1 1   2 1   SAB IAB Ectopic Multiple Live Births  1 1     1     # Outcome Date GA Lbr Len/2nd Weight Sex Delivery Anes PTL Lv  4 Current           3 IAB 2016          2 Term 09/21/13 [redacted]w[redacted]d  2580 g F Vag-Spont EPI N LIV  1 SAB 2014            Medical / Surgical History: Past medical history: History reviewed. No pertinent past medical history.  Past surgical history:  Past Surgical History:  Procedure Laterality Date  . DILATION AND CURETTAGE OF UTERUS  2014   Family History:  Family History  Adopted: Yes    Social History:  reports that she has never smoked. She has never used smokeless tobacco. She reports that she does not drink alcohol and does not use drugs.  Allergies: Patient has no known allergies.   Current Medications at time of admission:  Prior to Admission medications   Medication Sig Start Date End Date Taking? Authorizing Provider  Prenatal Vit-Fe Fumarate-FA  (MULTIVITAMIN-PRENATAL) 27-0.8 MG TABS tablet Take 1 tablet by mouth daily at 12 noon.   Yes [provider]    Review of Systems: Constitutional: Negative   HENT: Negative   Eyes: Negative   Respiratory: Negative   Cardiovascular: Negative   Gastrointestinal: Negative  Genitourinary: positive for bloody show, negative for LOF   Musculoskeletal: Negative   Skin: Negative   Neurological: Negative   Endo/Heme/Allergies: Negative   Psychiatric/Behavioral: Negative    Physical Exam: VS: Blood pressure 129/71, pulse 94, resp. rate 18, last menstrual period 12/06/2019. AAO x3, no signs of distress Cardiovascular: RRR Respiratory: Lung fields clear to ausculation GU/GI: Abdomen gravid, non-tender, non-distended, active FM, vertex Extremities: negative edema, negative for pain, tenderness, and cords  Cervical exam:Dilation: 7 Effacement (%): 90 Station: 0 Exam by:: 02/05/2020, RN FHR: baseline rate 125 / variability moderate / accelerations negative / variable decelerations TOCO: 2-4   Prenatal Transfer Tool  Maternal Diabetes: No Genetic Screening: Normal Maternal Ultrasounds/Referrals: Normal Fetal Ultrasounds or other Referrals:  None Maternal Substance Abuse:  No Significant Maternal Medications:  None Significant Maternal Lab Results: Group B Strep negative    Assessment: 30 y.o. [name] 39.2weeks, EDD 09/08/20 admitted for active labor.  active stage of labor FHR category 2 GBS negative Pain management plan: epidural   Plan:  Admit to L&D Routine admission orders Epidural PRN  Dr Mora Appl notified of admission and plan of care  Carollee Leitz MSN, CNM 09/03/2020 10:54 AM

## 2020-09-03 NOTE — MAU Note (Signed)
Pt c/o ctx all night. Now about 2-3 min apart.  Denies any vag bleeding or discharge or leaking a this time. Good fetal movement reported. 2-3 cm yesterday in office,

## 2020-09-04 ENCOUNTER — Encounter (HOSPITAL_COMMUNITY): Payer: Self-pay | Admitting: Obstetrics & Gynecology

## 2020-09-04 LAB — CBC
HCT: 29.9 % — ABNORMAL LOW (ref 36.0–46.0)
Hemoglobin: 9.6 g/dL — ABNORMAL LOW (ref 12.0–15.0)
MCH: 27.5 pg (ref 26.0–34.0)
MCHC: 32.1 g/dL (ref 30.0–36.0)
MCV: 85.7 fL (ref 80.0–100.0)
Platelets: 426 10*3/uL — ABNORMAL HIGH (ref 150–400)
RBC: 3.49 MIL/uL — ABNORMAL LOW (ref 3.87–5.11)
RDW: 16.9 % — ABNORMAL HIGH (ref 11.5–15.5)
WBC: 14 10*3/uL — ABNORMAL HIGH (ref 4.0–10.5)
nRBC: 0 % (ref 0.0–0.2)

## 2020-09-04 LAB — RPR: RPR Ser Ql: NONREACTIVE

## 2020-09-04 NOTE — Progress Notes (Signed)
Subjective: Postpartum Day # 1 : S/P NSVD due to was admitted on 2/3 in active labor and progressed naturally, SVD 2/3, over intact pernium with EBL of , hgb drop from 10.2-9.6, asymptomatic, on iron. Baby Males desires in pt circ, but has not urinated yet. Patient up ad lib, denies syncope or dizziness. Reports consuming regular diet without issues and denies N/V. Patient reports 0 bowel movement + passing flatus.  Denies issues with urination and reports bleeding is "lighter."  Patient is breastfeeding and reports going well.  Desires mini pill for postpartum contraception.  Pain is being appropriately managed with use of po meds.   No laceration Feeding:  Breast Contraceptive plan:  Mini Pill BB: Circ desires in pt  Prior to discharge, but has not urinated yet.   Objective: Vital signs in last 24 hours: Patient Vitals for the past 24 hrs:  BP Temp Temp src Pulse Resp SpO2  09/04/20 0500 119/71 98.2 F (36.8 C) -- 87 18 100 %  09/04/20 0000 120/64 98.2 F (36.8 C) Oral 87 18 100 %  09/03/20 1955 119/63 98.4 F (36.9 C) -- 88 18 99 %  09/03/20 1604 124/75 98.2 F (36.8 C) Oral 90 18 100 %  09/03/20 1500 128/76 98.6 F (37 C) Oral 96 19 98 %  09/03/20 1332 (!) 113/57 -- -- 86 -- --  09/03/20 1300 -- -- -- -- -- 99 %  09/03/20 1255 -- -- -- -- -- 98 %  09/03/20 1254 133/66 -- -- 89 -- --  09/03/20 1225 -- -- -- -- -- 99 %  09/03/20 1220 -- -- -- -- -- 99 %     Physical Exam:  General: alert, cooperative, appears stated age and no distress Mood/Affect: Happy Lungs: clear to auscultation, no wheezes, rales or rhonchi, symmetric air entry.  Heart: normal rate, regular rhythm, normal S1, S2, no murmurs, rubs, clicks or gallops. Breast: breasts appear normal, no suspicious masses, no skin or nipple changes or axillary nodes. Abdomen:  + bowel sounds, soft, non-tender GU: perineum intact, healing well. No signs of external hematomas.  Uterine Fundus: firm Lochia:  appropriate Skin: Warm, Dry. DVT Evaluation: No evidence of DVT seen on physical exam. Negative Homan's sign. No cords or calf tenderness. No significant calf/ankle edema.  CBC Latest Ref Rng & Units 09/04/2020 09/03/2020 02/06/2020  WBC 4.0 - 10.5 K/uL 14.0(H) 11.2(H) 6.7  Hemoglobin 12.0 - 15.0 g/dL 2.8(U) 10.2(L) 12.3  Hematocrit 36.0 - 46.0 % 29.9(L) 31.4(L) 37.1  Platelets 150 - 400 K/uL 426(H) 357 396    Results for orders placed or performed during the hospital encounter of 09/03/20 (from the past 24 hour(s))  CBC     Status: Abnormal   Collection Time: 09/04/20  5:56 AM  Result Value Ref Range   WBC 14.0 (H) 4.0 - 10.5 K/uL   RBC 3.49 (L) 3.87 - 5.11 MIL/uL   Hemoglobin 9.6 (L) 12.0 - 15.0 g/dL   HCT 13.2 (L) 44.0 - 10.2 %   MCV 85.7 80.0 - 100.0 fL   MCH 27.5 26.0 - 34.0 pg   MCHC 32.1 30.0 - 36.0 g/dL   RDW 72.5 (H) 36.6 - 44.0 %   Platelets 426 (H) 150 - 400 K/uL   nRBC 0.0 0.0 - 0.2 %     CBG (last 3)  No results for input(s): GLUCAP in the last 72 hours.   I/O last 3 completed shifts: In: -  Out: 100 [Blood:100]   Assessment Postpartum Day #  1 : S/P NSVD due to was admitted on 2/3 in active labor and progressed naturally, SVD 2/3, over intact pernium with EBL of , hgb drop from 10.2-9.6, asymptomatic, on iron. Baby Males desires in pt circ, but has not urinated yet Pt stable. -1 involution. breastfeeding. Hemodynamically stable.   Plan: Continue other mgmt as ordered VTE prophylactics: Early ambulated as tolerates.  Pain control: Motrin/Tylenol PRN Education given regarding options for contraception, including barrier methods, injectable contraception, IUD placement, oral contraceptives.  Plan for discharge tomorrow, Breastfeeding, Lactation consult, Circumcision prior to discharge and Contraception Mini PIlls   Dr. Su Hilt to be updated on patient status  [facility] NP-C, CNM 09/04/2020, 12:14 PM

## 2020-09-05 ENCOUNTER — Other Ambulatory Visit (HOSPITAL_COMMUNITY): Payer: Medicaid Other

## 2020-09-05 MED ORDER — IBUPROFEN 600 MG PO TABS: 600.0000 mg | ORAL_TABLET | Freq: Four times a day (QID) | ORAL | 0 refills | Status: DC

## 2020-09-05 MED ORDER — ACETAMINOPHEN 325 MG PO TABS: 650.0000 mg | ORAL_TABLET | ORAL | Status: DC | PRN

## 2020-09-05 NOTE — Discharge Summary (Signed)
Postpartum Discharge Summary  Date of Service updated 09/05/20    Patient Name: Cindy Hughes DOB: 05-Apr-1991 MRN: [id number]  Date of admission: 09/03/2020 Delivery date:09/03/2020  Delivering provider: Holli Humbles B  Date of discharge: 09/05/2020  Admitting diagnosis: CTX Intrauterine pregnancy: [redacted]w[redacted]d    Secondary diagnosis:  Active Problems:   [redacted] weeks gestation of pregnancy   SVD (spontaneous vaginal delivery)  Additional problems: none    Discharge diagnosis: Term Pregnancy Delivered                                              Post partum procedures:none Augmentation: AROM Complications: None  Hospital course: Onset of Labor With Vaginal Delivery      30y.o. yo [id number] admitted in Active Labor on 09/03/2020. Patient had an uncomplicated labor course as follows:  Membrane Rupture Time/Date: 12:10 PM ,09/03/2020   Delivery Method:Vaginal, Spontaneous  Episiotomy: None  Lacerations:  Labial  Patient had an uncomplicated postpartum course.  She is ambulating, tolerating a regular diet, passing flatus, and urinating well. Patient is discharged home in stable condition on 09/05/20.  Newborn Data: Birth date:09/03/2020  Birth time:12:13 PM  Gender:Female  Living status:Living  Apgars:7 ,9  Weight:3530 g   Magnesium Sulfate received: No BMZ received: No Rhophylac:N/A MMR:N/A T-DaP:Given prenatally Flu: Yes Transfusion:No  Physical exam  Vitals:   09/04/20 0500 09/04/20 1441 09/04/20 2242 09/05/20 0555  BP: 119/71 109/63 120/85 126/86  Pulse: 87 80 77 72  Resp: '18  18 18  ' Temp: 98.2 F (36.8 C) 98 F (36.7 C) 98.2 F (36.8 C) 97.6 F (36.4 C)  TempSrc:  Oral Oral Oral  SpO2: 100%   100%   General: alert, cooperative and no distress Lochia: appropriate Uterine Fundus: firm Incision: N/A DVT Evaluation: No evidence of DVT seen on physical exam. No cords or calf tenderness. No significant calf/ankle edema. Labs: Lab Results  Component  Value Date   WBC 14.0 (H) 09/04/2020   HGB 9.6 (L) 09/04/2020   HCT 29.9 (L) 09/04/2020   MCV 85.7 09/04/2020   PLT 426 (H) 09/04/2020   No flowsheet data found. Edinburgh Score: Edinburgh Postnatal Depression Scale Screening Tool 09/03/2020  I have been able to laugh and see the funny side of things. 0  I have looked forward with enjoyment to things. 0  I have blamed myself unnecessarily when things went wrong. 1  I have been anxious or worried for no good reason. 1  I have felt scared or panicky for no good reason. 1  Things have been getting on top of me. 0  I have been so unhappy that I have had difficulty sleeping. 0  I have felt sad or miserable. 0  I have been so unhappy that I have been crying. 0  The thought of harming myself has occurred to me. 0  Edinburgh Postnatal Depression Scale Total 3      After visit meds:  Allergies as of 09/05/2020   No Known Allergies     Medication List    TAKE these medications   acetaminophen 325 MG tablet Commonly known as: Tylenol Take 2 tablets (650 mg total) by mouth every 4 (four) hours as needed (for pain scale < 4).   ibuprofen 600 MG tablet Commonly known as: ADVIL Take 1 tablet (600 mg total) by mouth every 6 (  six) hours.   prenatal multivitamin Tabs tablet Take 1 tablet by mouth daily at 12 noon.        Discharge home in stable condition Infant Feeding: Breast Infant Disposition:home with mother Discharge instruction: per After Visit Summary and Postpartum booklet. Activity: Advance as tolerated. Pelvic rest for 6 weeks.  Diet: low salt diet Anticipated Birth Control: POPs Postpartum Appointment:6 weeks Additional Postpartum F/U: none Future Appointments:No future appointments. Follow up Visit:  [facility]. Schedule an appointment as soon as possible for a visit in 6 week(s).   Specialty: Obstetrics and Gynecology Contact information: [address] [place] [address] [phone]                  09/05/2020 Arrie Eastern, CNM

## 2020-09-08 ENCOUNTER — Inpatient Hospital Stay (HOSPITAL_COMMUNITY): Payer: Medicaid Other

## 2020-09-08 ENCOUNTER — Inpatient Hospital Stay (HOSPITAL_COMMUNITY)
Admission: AD | Admit: 2020-09-08 | Payer: Medicaid Other | Source: Home / Self Care | Admitting: Obstetrics & Gynecology

## 2021-08-01 NOTE — L&D Delivery Note (Signed)
Delivery Note Called to room for delivery as patient felt urge to push and was completely dilated. Upon arrival to room, infant was delivered en caul with RN and vigorous cry noted. Infant placed on mother's abdomen. Delayed cord clamping was performed for 60 seconds. Venous cord blood was collected. Placenta delivered spontaneously. Cervix, vagina, perineum and labia were inspected, no tears were found. Uterus fundus firm. Placenta was inspected, found to be intact with a 3 vessel cord. Counts were correct.   At 4:37 AM a viable and healthy female was delivered via Vaginal, Spontaneous (Presentation:   En caul).  APGAR: 8, 9; weight  .   Placenta status: Spontaneous;Pathology, Intact.  Cord: 3 vessels with the following complications: None.  Cord pH: No  Anesthesia: None Episiotomy:  N/A Lacerations:  None Suture Repair: N/A Est. Blood Loss (mL):  152  Mom to postpartum.  Baby to Couplet care / Skin to Skin.  Drema Dallas 04/28/2022, 4:51 AM

## 2021-10-20 LAB — OB RESULTS CONSOLE HEPATITIS B SURFACE ANTIGEN: Hepatitis B Surface Ag: NEGATIVE

## 2021-10-20 LAB — HEPATITIS C ANTIBODY: HCV Ab: NEGATIVE

## 2021-10-20 LAB — OB RESULTS CONSOLE HIV ANTIBODY (ROUTINE TESTING): HIV: NONREACTIVE

## 2021-10-20 LAB — OB RESULTS CONSOLE GC/CHLAMYDIA
Chlamydia: NEGATIVE
Neisseria Gonorrhea: NEGATIVE

## 2021-10-20 LAB — OB RESULTS CONSOLE RPR: RPR: NONREACTIVE

## 2021-10-20 LAB — OB RESULTS CONSOLE RUBELLA ANTIBODY, IGM: Rubella: IMMUNE

## 2021-11-25 ENCOUNTER — Other Ambulatory Visit: Payer: Self-pay | Admitting: Obstetrics & Gynecology

## 2021-11-25 ENCOUNTER — Other Ambulatory Visit: Payer: Self-pay

## 2021-11-25 DIAGNOSIS — Z363 Encounter for antenatal screening for malformations: Secondary | ICD-10-CM

## 2021-12-09 ENCOUNTER — Encounter: Payer: Self-pay | Admitting: *Deleted

## 2021-12-14 ENCOUNTER — Other Ambulatory Visit: Payer: Self-pay | Admitting: *Deleted

## 2021-12-14 ENCOUNTER — Ambulatory Visit: Payer: Medicaid Other | Attending: Obstetrics & Gynecology | Admitting: Obstetrics

## 2021-12-14 ENCOUNTER — Ambulatory Visit: Payer: Medicaid Other | Attending: Obstetrics & Gynecology

## 2021-12-14 ENCOUNTER — Ambulatory Visit: Payer: Medicaid Other | Admitting: *Deleted

## 2021-12-14 VITALS — BP 133/77 | HR 82

## 2021-12-14 DIAGNOSIS — O99212 Obesity complicating pregnancy, second trimester: Secondary | ICD-10-CM

## 2021-12-14 DIAGNOSIS — Z363 Encounter for antenatal screening for malformations: Secondary | ICD-10-CM | POA: Diagnosis not present

## 2021-12-14 DIAGNOSIS — Z3A19 19 weeks gestation of pregnancy: Secondary | ICD-10-CM | POA: Diagnosis not present

## 2021-12-14 NOTE — Progress Notes (Signed)
MFM Note ? ?Cindy Hughes was seen for a detailed fetal anatomy scan due to maternal obesity with a BMI of 46. ? ?She denies any significant past medical history and denies any problems in her current pregnancy.   ? ?She had a cell free DNA test earlier in her pregnancy which indicated a low risk for trisomy 15, 44, and 13. A female fetus is predicted.  ? ?She was informed that the fetal growth and amniotic fluid level were appropriate for her gestational age.  ? ?There were no obvious fetal anomalies noted on today's ultrasound exam. ? ?The patient was informed that anomalies may be missed due to technical limitations. If the fetus is in a suboptimal position or maternal habitus is increased, visualization of the fetus in the maternal uterus may be impaired. ? ?The following were discussed during today's consultation:  ? ?Obesity in pregnancy ? ?The recommended total weight gain in pregnancy for obese women's between 10 to 20 pounds. ? ?She should be screened for gestational diabetes at between 24 to 28 weeks. ? ?We will continue to follow her with monthly growth ultrasounds. ? ?Due to the increased risk of stillbirth in obese women with a BMI of greater than 40, weekly antenatal fetal surveillance should be started beginning at 34 weeks. ? ?As maternal obesity may present challenges associated with the management of anesthesia, an anesthesia consult should be obtained when she is admitted in labor. ? ?As African-American women with a BMI of greater than 30 may be at increased risk for developing preeclampsia, she should start taking a daily baby aspirin (81 mg daily) as soon as possible and continue the daily baby aspirin for the duration of her pregnancy. ? ?A follow-up exam was scheduled in 4 weeks. ? ?The patient stated that all of her questions were answered today. ? ?A total of 30 minutes was spent counseling and coordinating the care for this patient.  Greater than 50% of the time was spent in direct  face-to-face contact.  ?

## 2022-01-12 ENCOUNTER — Other Ambulatory Visit: Payer: Self-pay | Admitting: *Deleted

## 2022-01-12 ENCOUNTER — Ambulatory Visit: Payer: Medicaid Other | Admitting: *Deleted

## 2022-01-12 ENCOUNTER — Encounter: Payer: Self-pay | Admitting: *Deleted

## 2022-01-12 ENCOUNTER — Ambulatory Visit: Payer: Medicaid Other | Attending: Obstetrics

## 2022-01-12 VITALS — BP 116/74 | HR 94

## 2022-01-12 DIAGNOSIS — O09892 Supervision of other high risk pregnancies, second trimester: Secondary | ICD-10-CM | POA: Diagnosis not present

## 2022-01-12 DIAGNOSIS — Z6841 Body Mass Index (BMI) 40.0 and over, adult: Secondary | ICD-10-CM | POA: Insufficient documentation

## 2022-01-12 DIAGNOSIS — O99212 Obesity complicating pregnancy, second trimester: Secondary | ICD-10-CM

## 2022-01-12 DIAGNOSIS — Z3A23 23 weeks gestation of pregnancy: Secondary | ICD-10-CM | POA: Diagnosis not present

## 2022-01-12 DIAGNOSIS — E669 Obesity, unspecified: Secondary | ICD-10-CM

## 2022-01-12 DIAGNOSIS — R638 Other symptoms and signs concerning food and fluid intake: Secondary | ICD-10-CM

## 2022-02-16 ENCOUNTER — Encounter: Payer: Self-pay | Admitting: *Deleted

## 2022-02-16 ENCOUNTER — Ambulatory Visit: Payer: Medicaid Other | Admitting: *Deleted

## 2022-02-16 ENCOUNTER — Ambulatory Visit: Payer: Medicaid Other | Attending: Maternal & Fetal Medicine

## 2022-02-16 VITALS — BP 121/73 | HR 91

## 2022-02-16 DIAGNOSIS — R638 Other symptoms and signs concerning food and fluid intake: Secondary | ICD-10-CM | POA: Insufficient documentation

## 2022-02-16 DIAGNOSIS — Z6841 Body Mass Index (BMI) 40.0 and over, adult: Secondary | ICD-10-CM | POA: Diagnosis present

## 2022-02-16 DIAGNOSIS — O09293 Supervision of pregnancy with other poor reproductive or obstetric history, third trimester: Secondary | ICD-10-CM

## 2022-02-16 DIAGNOSIS — O99213 Obesity complicating pregnancy, third trimester: Secondary | ICD-10-CM | POA: Diagnosis not present

## 2022-02-16 DIAGNOSIS — E669 Obesity, unspecified: Secondary | ICD-10-CM

## 2022-02-16 DIAGNOSIS — Z3A28 28 weeks gestation of pregnancy: Secondary | ICD-10-CM | POA: Diagnosis not present

## 2022-03-24 ENCOUNTER — Encounter (HOSPITAL_COMMUNITY): Payer: Self-pay | Admitting: Obstetrics & Gynecology

## 2022-03-24 ENCOUNTER — Inpatient Hospital Stay (HOSPITAL_COMMUNITY)
Admission: AD | Admit: 2022-03-24 | Discharge: 2022-03-24 | Disposition: A | Discharge status: Home / Self Care | Payer: Medicaid Other | Attending: Obstetrics & Gynecology | Admitting: Obstetrics & Gynecology

## 2022-03-24 DIAGNOSIS — R141 Gas pain: Secondary | ICD-10-CM

## 2022-03-24 DIAGNOSIS — K2900 Acute gastritis without bleeding: Secondary | ICD-10-CM

## 2022-03-24 DIAGNOSIS — K219 Gastro-esophageal reflux disease without esophagitis: Secondary | ICD-10-CM | POA: Diagnosis not present

## 2022-03-24 DIAGNOSIS — K5900 Constipation, unspecified: Secondary | ICD-10-CM | POA: Diagnosis not present

## 2022-03-24 DIAGNOSIS — Z3A33 33 weeks gestation of pregnancy: Secondary | ICD-10-CM | POA: Diagnosis not present

## 2022-03-24 DIAGNOSIS — O26893 Other specified pregnancy related conditions, third trimester: Secondary | ICD-10-CM | POA: Insufficient documentation

## 2022-03-24 DIAGNOSIS — O99619 Diseases of the digestive system complicating pregnancy, unspecified trimester: Secondary | ICD-10-CM | POA: Diagnosis not present

## 2022-03-24 DIAGNOSIS — R1010 Upper abdominal pain, unspecified: Secondary | ICD-10-CM | POA: Insufficient documentation

## 2022-03-24 DIAGNOSIS — O99613 Diseases of the digestive system complicating pregnancy, third trimester: Secondary | ICD-10-CM | POA: Insufficient documentation

## 2022-03-24 MED ORDER — ALUM & MAG HYDROXIDE-SIMETH 200-200-20 MG/5ML PO SUSP
15.0000 mL | Freq: Once | ORAL | Status: DC
  Filled 2022-03-24: qty 30

## 2022-03-24 MED ORDER — PANTOPRAZOLE SODIUM 20 MG PO TBEC: 20.0000 mg | DELAYED_RELEASE_TABLET | Freq: Every day | ORAL | 5 refills | Status: DC

## 2022-03-24 MED ORDER — SIMETHICONE 80 MG PO CHEW
80.0000 mg | CHEWABLE_TABLET | Freq: Once | ORAL | Status: AC
  Administered 2022-03-24: 80 mg via ORAL
  Filled 2022-03-24: qty 1

## 2022-03-24 MED ORDER — ALUM & MAG HYDROXIDE-SIMETH 200-200-20 MG/5ML PO SUSP
30.0000 mL | Freq: Once | ORAL | Status: AC
  Administered 2022-03-24: 30 mL via ORAL

## 2022-03-24 MED ORDER — LIDOCAINE VISCOUS HCL 2 % MT SOLN
15.0000 mL | Freq: Once | OROMUCOSAL | Status: AC
  Administered 2022-03-24: 15 mL via OROMUCOSAL
  Filled 2022-03-24: qty 15

## 2022-03-24 MED ORDER — PANTOPRAZOLE SODIUM 40 MG PO TBEC
40.0000 mg | DELAYED_RELEASE_TABLET | Freq: Once | ORAL | Status: AC
  Administered 2022-03-24: 40 mg via ORAL
  Filled 2022-03-24: qty 1

## 2022-03-24 NOTE — MAU Note (Signed)
.  Cindy Hughes is a 31 y.o. at [redacted]w[redacted]d here in MAU reporting: sharp epigastric pain that woke her up out of her sleep. Patient states this has been happening more frequently. She did take a Gas X and it reduced the pain from 10/10 to 2/10. She just want to come be sure everything was okay because she has never experienced this before. Denies VB or LOF. +FM.   Onset of complaint: 0100 Pain score: 2 Vitals:   03/24/22 0909  BP: 139/83  Pulse: 85  Resp: 20  Temp: 98.2 F (36.8 C)  SpO2: 100%     FHT:143

## 2022-03-24 NOTE — MAU Provider Note (Signed)
History     [id number]  Arrival date and time: 03/24/22 2774    Chief Complaint  Patient presents with   Abdominal Pain     HPI Cindy Hughes is a 31 y.o. at [redacted]w[redacted]d with PMHx notable for two prior NSVD, who presents for upper abdominal pain.   Review of outside prenatal records from [facility] (in media tab): unfortunately none are available for review despite advanced gestational age  Patient reports that she had a sharp pain in her upper abdomen that woke her up around 1 AM Says that it feels like a bad gas bubble Located in epigastric/RUQ area This is her third episode In the past they have gone away with a combination of position changes or using gas x In the past have lasted about an hour No associated nausea or sweating This episode though has been ongoing since 0100 and she is very miserable Has struggled with acid reflux this pregnancy and does feel like she also has symptoms of that, does not take any meds for it as she does not like to take meds during pregnancy Does endorse some constipation of late No vaginal bleeding or leaking fluid No contractions Endorses normal fetal movement      OB History     Gravida  5   Para  2   Term  2   Preterm      AB  2   Living  2      SAB  1   IAB  1   Ectopic      Multiple  0   Live Births  2           Past Medical History:  Diagnosis Date   Irregular heart beat    Trichimoniasis    Vitamin D deficiency disease     Past Surgical History:  Procedure Laterality Date   DILATION AND CURETTAGE OF UTERUS  2014    Family History  Adopted: Yes    Social History   Socioeconomic History   Marital status: Married    Spouse name: Not on file   Number of children: Not on file   Years of education: Not on file   Highest education level: Not on file  Occupational History   Not on file  Tobacco Use   Smoking status: Never   Smokeless tobacco: Never  Vaping Use   Vaping Use:  Never used  Substance and Sexual Activity   Alcohol use: Never   Drug use: Never   Sexual activity: Yes  Other Topics Concern   Not on file  Social History Narrative   Not on file   Social Determinants of Health   Financial Resource Strain: Not on file  Food Insecurity: Not on file  Transportation Needs: Not on file  Physical Activity: Not on file  Stress: Not on file  Social Connections: Not on file  Intimate Partner Violence: Not on file    No Known Allergies  No current facility-administered medications on file prior to encounter.   Current Outpatient Medications on File Prior to Encounter  Medication Sig Dispense Refill   simethicone (MYLICON) 125 MG chewable tablet Chew 125 mg by mouth every 6 (six) hours as needed for flatulence.     acetaminophen (TYLENOL) 325 MG tablet Take 2 tablets (650 mg total) by mouth every 4 (four) hours as needed (for pain scale < 4). (Patient not taking: Reported on 01/12/2022)     Prenatal Vit-Fe Fumarate-FA (PRENATAL MULTIVITAMIN)  TABS tablet Take 1 tablet by mouth daily at 12 noon.       ROS Pertinent positives and negative per HPI, all others reviewed and negative  Physical Exam   BP 124/75 (BP Location: Right Arm)   Pulse 78   Temp 98.2 F (36.8 C) (Oral)   Resp 14   Ht 5\' 2"  (1.575 m)   Wt 118.5 kg   SpO2 100%   BMI 47.77 kg/m   Patient Vitals for the past 24 hrs:  BP Temp Temp src Pulse Resp SpO2 Height Weight  03/24/22 1132 124/75 -- -- 78 14 100 % -- --  03/24/22 0946 120/74 -- -- 89 -- -- -- --  03/24/22 0909 139/83 98.2 F (36.8 C) Oral 85 20 100 % 5\' 2"  (1.575 m) 118.5 kg    Physical Exam Vitals reviewed.  Constitutional:      General: She is not in acute distress.    Appearance: She is well-developed. She is not diaphoretic.  Eyes:     General: No scleral icterus. Pulmonary:     Effort: Pulmonary effort is normal. No respiratory distress.  Abdominal:     General: There is no distension.     Palpations:  Abdomen is soft.     Tenderness: There is no abdominal tenderness. There is no guarding or rebound.  Skin:    General: Skin is warm and dry.  Neurological:     Mental Status: She is alert.     Coordination: Coordination normal.      Cervical Exam    Bedside Ultrasound Not done  My interpretation: n/a  FHT Baseline 140, moderate variability, +accels, no decels Toco: quiet Cat: I  Labs No results found for this or any previous visit (from the past 24 hour(s)).  Imaging No results found.  MAU Course  Procedures Lab Orders  No laboratory test(s) ordered today   Meds ordered this encounter  Medications   DISCONTD: alum & mag hydroxide-simeth (MAALOX/MYLANTA) 200-200-20 MG/5ML suspension 15 mL   pantoprazole (PROTONIX) EC tablet 40 mg   alum & mag hydroxide-simeth (MAALOX/MYLANTA) 200-200-20 MG/5ML suspension 30 mL   lidocaine (XYLOCAINE) 2 % viscous mouth solution 15 mL   simethicone (MYLICON) chewable tablet 80 mg   pantoprazole (PROTONIX) 20 MG tablet    Sig: Take 1 tablet (20 mg total) by mouth daily.    Dispense:  30 tablet    Refill:  5   Imaging Orders  No imaging studies ordered today    MDM moderate  Assessment and Plan  #GERD in pregnancy #Gas pain #[redacted] weeks gestation of pregnancy Pain resolved with above intervention. Suspect likely secondary to combination of gas pain, GERD, gastritis. Has simethicone at home, accepts home rx for PPI, advised can increase to BID if needed. Low suspicion for other etiology such as coronary vasospasm given duration of symptoms. No relationship to eating so unlikely gallbladder pathology. Provided reassurance.   #FWB FHT Cat I NST: Reactive   Dispo: discharged to home in stable condition.    03/26/22, MD/MPH 03/24/22 11:55 AM  Allergies as of 03/24/2022   No Known Allergies      Medication List     TAKE these medications    acetaminophen 325 MG tablet Commonly known as: Tylenol Take 2  tablets (650 mg total) by mouth every 4 (four) hours as needed (for pain scale < 4).   pantoprazole 20 MG tablet Commonly known as: PROTONIX Take 1 tablet (20 mg total) by mouth  daily.   prenatal multivitamin Tabs tablet Take 1 tablet by mouth daily at 12 noon.   simethicone 125 MG chewable tablet Commonly known as: MYLICON Chew 125 mg by mouth every 6 (six) hours as needed for flatulence.

## 2022-04-12 ENCOUNTER — Other Ambulatory Visit: Payer: Self-pay | Admitting: Obstetrics and Gynecology

## 2022-04-12 DIAGNOSIS — R1011 Right upper quadrant pain: Secondary | ICD-10-CM

## 2022-04-19 ENCOUNTER — Other Ambulatory Visit: Payer: Medicaid Other

## 2022-04-20 ENCOUNTER — Other Ambulatory Visit: Payer: Medicaid Other

## 2022-04-21 ENCOUNTER — Other Ambulatory Visit: Payer: Self-pay | Admitting: Obstetrics and Gynecology

## 2022-04-21 DIAGNOSIS — Z363 Encounter for antenatal screening for malformations: Secondary | ICD-10-CM

## 2022-04-25 ENCOUNTER — Inpatient Hospital Stay (HOSPITAL_COMMUNITY)
Admission: AD | Admit: 2022-04-25 | Discharge: 2022-04-25 | Disposition: A | Discharge status: Home / Self Care | Payer: Medicaid Other | Attending: Obstetrics and Gynecology | Admitting: Obstetrics and Gynecology

## 2022-04-25 ENCOUNTER — Inpatient Hospital Stay (HOSPITAL_COMMUNITY): Payer: Medicaid Other

## 2022-04-25 ENCOUNTER — Encounter (HOSPITAL_COMMUNITY): Payer: Self-pay | Admitting: Obstetrics and Gynecology

## 2022-04-25 DIAGNOSIS — Z3689 Encounter for other specified antenatal screening: Secondary | ICD-10-CM | POA: Diagnosis not present

## 2022-04-25 DIAGNOSIS — O26613 Liver and biliary tract disorders in pregnancy, third trimester: Secondary | ICD-10-CM | POA: Diagnosis not present

## 2022-04-25 DIAGNOSIS — K8020 Calculus of gallbladder without cholecystitis without obstruction: Secondary | ICD-10-CM | POA: Diagnosis not present

## 2022-04-25 DIAGNOSIS — O1493 Unspecified pre-eclampsia, third trimester: Secondary | ICD-10-CM | POA: Insufficient documentation

## 2022-04-25 DIAGNOSIS — Z3A38 38 weeks gestation of pregnancy: Secondary | ICD-10-CM

## 2022-04-25 DIAGNOSIS — O368130 Decreased fetal movements, third trimester, not applicable or unspecified: Secondary | ICD-10-CM | POA: Diagnosis present

## 2022-04-25 DIAGNOSIS — R030 Elevated blood-pressure reading, without diagnosis of hypertension: Secondary | ICD-10-CM

## 2022-04-25 LAB — COMPREHENSIVE METABOLIC PANEL
ALT: 16 U/L (ref 0–44)
AST: 27 U/L (ref 15–41)
Albumin: 2.7 g/dL — ABNORMAL LOW (ref 3.5–5.0)
Alkaline Phosphatase: 104 U/L (ref 38–126)
Anion gap: 10 (ref 5–15)
BUN: 6 mg/dL (ref 6–20)
CO2: 20 mmol/L — ABNORMAL LOW (ref 22–32)
Calcium: 9 mg/dL (ref 8.9–10.3)
Chloride: 105 mmol/L (ref 98–111)
Creatinine, Ser: 0.61 mg/dL (ref 0.44–1.00)
GFR, Estimated: >60 mL/min (ref >60)
Glucose, Bld: 98 mg/dL (ref 70–99)
Potassium: 3.3 mmol/L — ABNORMAL LOW (ref 3.5–5.1)
Sodium: 135 mmol/L (ref 135–145)
Total Bilirubin: 1 mg/dL (ref 0.3–1.2)
Total Protein: 6.9 g/dL (ref 6.5–8.1)

## 2022-04-25 LAB — URINALYSIS, ROUTINE W REFLEX MICROSCOPIC
Bilirubin Urine: NEGATIVE
Glucose, UA: NEGATIVE mg/dL
Hgb urine dipstick: NEGATIVE
Ketones, ur: NEGATIVE mg/dL
Leukocytes,Ua: NEGATIVE
Nitrite: NEGATIVE
Protein, ur: NEGATIVE mg/dL
Specific Gravity, Urine: 1.025 (ref 1.005–1.030)
pH: 7 (ref 5.0–8.0)

## 2022-04-25 LAB — CBC
HCT: 29.9 % — ABNORMAL LOW (ref 36.0–46.0)
Hemoglobin: 9.9 g/dL — ABNORMAL LOW (ref 12.0–15.0)
MCH: 27.2 pg (ref 26.0–34.0)
MCHC: 33.1 g/dL (ref 30.0–36.0)
MCV: 82.1 fL (ref 80.0–100.0)
Platelets: 453 10*3/uL — ABNORMAL HIGH (ref 150–400)
RBC: 3.64 MIL/uL — ABNORMAL LOW (ref 3.87–5.11)
RDW: 15.5 % (ref 11.5–15.5)
WBC: 8.6 10*3/uL (ref 4.0–10.5)
nRBC: 0 % (ref 0.0–0.2)

## 2022-04-25 LAB — PROTEIN / CREATININE RATIO, URINE
Creatinine, Urine: 216 mg/dL
Protein Creatinine Ratio: 0.18 mg/mg{Cre} — ABNORMAL HIGH (ref 0.00–0.15)
Total Protein, Urine: 38 mg/dL

## 2022-04-25 LAB — LIPASE, BLOOD: Lipase: 26 U/L (ref 11–51)

## 2022-04-25 MED ORDER — OXYCODONE-ACETAMINOPHEN 5-325 MG PO TABS
2.0000 | ORAL_TABLET | Freq: Once | ORAL | Status: AC
  Administered 2022-04-25: 2 via ORAL
  Filled 2022-04-25: qty 2

## 2022-04-25 MED ORDER — ALUM & MAG HYDROXIDE-SIMETH 200-200-20 MG/5ML PO SUSP
30.0000 mL | Freq: Once | ORAL | Status: AC
  Administered 2022-04-25: 30 mL via ORAL
  Filled 2022-04-25: qty 30

## 2022-04-25 NOTE — MAU Provider Note (Signed)
History     CSN: [id number]  Arrival date and time: 04/25/22 [id number]   Event Date/Time   First Provider Initiated Contact with Patient 04/25/22 0304      Chief Complaint  Patient presents with   RUQ pain   Decreased Fetal Movement   Cindy Hughes is a 31 y.o. [id number] at [redacted]w[redacted]d who receives care at [facility].  She reports she has an ultrasound appointment on Wednesday and a provider appointment on Friday.  She presents today for RUQ pain and Decreased Fetal Movement.  Patient states the pain started around 1230 and has been a constant "punch in the stomach" type sensation.  She states the pain radiates around and up her back and has no relieving or worsening factors.  She rates the pain a 7/10.  She denies headache, visual disturbances, or SOB.  She states she has been monitored in the office for preeclampsia due to protein in her urine.  She also states she has had some elevated blood pressures in the office but nothing above 136/80s.  Unfortunately records are only available up until 31 weeks for provider review.  Patient reports fetus was not moving at onset of pain, but is now moving.  Patient reports she ate a burger and onion rings for dinner around 5 PM.    OB History     Gravida  5   Para  2   Term  2   Preterm      AB  2   Living  2      SAB  1   IAB  1   Ectopic      Multiple  0   Live Births  2           Past Medical History:  Diagnosis Date   Irregular heart beat    Trichimoniasis    Vitamin D deficiency disease     Past Surgical History:  Procedure Laterality Date   DILATION AND CURETTAGE OF UTERUS  2014    Family History  Adopted: Yes    Social History   Tobacco Use   Smoking status: Never   Smokeless tobacco: Never  Vaping Use   Vaping Use: Never used  Substance Use Topics   Alcohol use: Never   Drug use: Never    Allergies: No Known Allergies  Medications Prior to Admission  Medication Sig Dispense  Refill Last Dose   acetaminophen (TYLENOL) 325 MG tablet Take 2 tablets (650 mg total) by mouth every 4 (four) hours as needed (for pain scale < 4). (Patient not taking: Reported on 01/12/2022)      pantoprazole (PROTONIX) 20 MG tablet Take 1 tablet (20 mg total) by mouth daily. 30 tablet 5    Prenatal Vit-Fe Fumarate-FA (PRENATAL MULTIVITAMIN) TABS tablet Take 1 tablet by mouth daily at 12 noon.      simethicone (MYLICON) 0000000 MG chewable tablet Chew 125 mg by mouth every 6 (six) hours as needed for flatulence.       Review of Systems  Constitutional:  Negative for chills and fever.  Respiratory:  Negative for cough and shortness of breath.   Gastrointestinal:  Positive for abdominal pain. Negative for constipation, diarrhea, nausea and vomiting.  Genitourinary:  Negative for difficulty urinating, dysuria, vaginal bleeding and vaginal discharge.  Musculoskeletal:  Positive for back pain.  Neurological:  Negative for dizziness, light-headedness and headaches.   Physical Exam   Blood pressure (!) 143/82, pulse 89, temperature 98.1 F (36.7 C),  resp. rate 16, height 5\' 2"  (1.575 m), weight 122.1 kg, SpO2 100 %, unknown if currently breastfeeding. Vitals:   04/25/22 0242 04/25/22 0305  BP: (!) 143/82 (!) 140/79  Pulse: 89 95  Resp: 16 18  Temp: 98.1 F (36.7 C)   SpO2: 100% 100%  Weight: 122.1 kg   Height: 5\' 2"  (1.575 m)     Physical Exam Vitals reviewed.  Constitutional:      General: She is in acute distress (Mild with movement).     Appearance: Normal appearance.  HENT:     Head: Normocephalic and atraumatic.  Eyes:     Conjunctiva/sclera: Conjunctivae normal.  Cardiovascular:     Rate and Rhythm: Normal rate.  Pulmonary:     Effort: Pulmonary effort is normal. No respiratory distress.  Abdominal:     Palpations: Abdomen is soft.     Tenderness: There is abdominal tenderness in the right upper quadrant.     Comments: Gravid, Appears LGA  Musculoskeletal:         General: Normal range of motion.     Cervical back: Normal range of motion.  Skin:    General: Skin is dry.  Neurological:     Mental Status: She is alert and oriented to person, place, and time.  Psychiatric:        Mood and Affect: Mood normal.        Behavior: Behavior normal.    Fetal Assessment 135 bpm, Mod Var, -Decels, +Accels Toco: Q4-53min  MAU Course   Results for orders placed or performed during the hospital encounter of 04/25/22 (from the past 24 hour(s))  Urinalysis, Routine w reflex microscopic     Status: Abnormal   Collection Time: 04/25/22  2:53 AM  Result Value Ref Range   Color, Urine YELLOW YELLOW   APPearance CLOUDY (A) CLEAR   Specific Gravity, Urine 1.025 1.005 - 1.030   pH 7.0 5.0 - 8.0   Glucose, UA NEGATIVE NEGATIVE mg/dL   Hgb urine dipstick NEGATIVE NEGATIVE   Bilirubin Urine NEGATIVE NEGATIVE   Ketones, ur NEGATIVE NEGATIVE mg/dL   Protein, ur NEGATIVE NEGATIVE mg/dL   Nitrite NEGATIVE NEGATIVE   Leukocytes,Ua NEGATIVE NEGATIVE  Protein / creatinine ratio, urine     Status: Abnormal   Collection Time: 04/25/22  2:53 AM  Result Value Ref Range   Creatinine, Urine 216 mg/dL   Total Protein, Urine 38 mg/dL   Protein Creatinine Ratio 0.18 (H) 0.00 - 0.15 mg/mg[Cre]  CBC     Status: Abnormal   Collection Time: 04/25/22  3:32 AM  Result Value Ref Range   WBC 8.6 4.0 - 10.5 K/uL   RBC 3.64 (L) 3.87 - 5.11 MIL/uL   Hemoglobin 9.9 (L) 12.0 - 15.0 g/dL   HCT 29.9 (L) 36.0 - 46.0 %   MCV 82.1 80.0 - 100.0 fL   MCH 27.2 26.0 - 34.0 pg   MCHC 33.1 30.0 - 36.0 g/dL   RDW 15.5 11.5 - 15.5 %   Platelets 453 (H) 150 - 400 K/uL   nRBC 0.0 0.0 - 0.2 %  Comprehensive metabolic panel     Status: Abnormal   Collection Time: 04/25/22  3:32 AM  Result Value Ref Range   Sodium 135 135 - 145 mmol/L   Potassium 3.3 (L) 3.5 - 5.1 mmol/L   Chloride 105 98 - 111 mmol/L   CO2 20 (L) 22 - 32 mmol/L   Glucose, Bld 98 70 - 99 mg/dL   BUN 6  6 - 20 mg/dL    Creatinine, Ser 0.61 0.44 - 1.00 mg/dL   Calcium 9.0 8.9 - 10.3 mg/dL   Total Protein 6.9 6.5 - 8.1 g/dL   Albumin 2.7 (L) 3.5 - 5.0 g/dL   AST 27 15 - 41 U/L   ALT 16 0 - 44 U/L   Alkaline Phosphatase 104 38 - 126 U/L   Total Bilirubin 1.0 0.3 - 1.2 mg/dL   GFR, Estimated >60 >60 mL/min   Anion gap 10 5 - 15  Lipase, blood     Status: None   Collection Time: 04/25/22  3:32 AM  Result Value Ref Range   Lipase 26 11 - 51 U/L   US ABDOMEN LIMITED RUQ (LIVER/GB)  Result Date: 04/25/2022 CLINICAL DATA:  31 year old female with right upper quadrant pain. EXAM: ULTRASOUND ABDOMEN LIMITED RIGHT UPPER QUADRANT COMPARISON:  Ob ultrasound 02/16/2022. FINDINGS: Gallbladder: An oval 7-10 mm gallstone with mild shadowing is visible in the fundus (series 1, image 29). Gallbladder wall thickness remains normal up to 3 mm. No pericholecystic fluid. However, a positive sonographic Percell Miller sign is elicited. Common bile duct: Diameter: 8 mm, dilated. No filling defect identified in the visible duct. Liver: Intrahepatic biliary ductal dilatation (series 1, image 53). Background liver echogenicity is at the upper limits of normal. No discrete liver lesion. Portal vein is patent on color Doppler imaging with normal direction of blood flow towards the liver. Other: Negative visible right kidney. IMPRESSION: Positive for gallstone(s), with dilated CBD and intrahepatic biliary ductal dilatation compatible with Acute Choledocholithiasis. Positive sonographic Percell Miller sign also, but absent gallbladder wall thickening argues against cholecystitis at this time. Electronically Signed   By: Genevie Ann M.D.   On: 04/25/2022 04:12    MDM Review of Prenatal Records Physical Exam Labs: CBC, CMP, PC Ratio Measure BPQ15 min EFM Pain Management GI Cocktail RUQ Ultrasound Coordination of Care Assessment and Plan  31 year old [id number]  SIUP at 38 weeks Cat I FT RUQ Pain Elevated BP  -PNR from [facility] reviewed. No notes after 31  weeks.  -POC reviewed. -Exam performed. -Patient offered and accepts pain medication. -Labs ordered. -Send for ultrasound to rule out gallstones. -Discussed potential for admission and induction of labor if blood pressures remain elevated and/or labs are abnormal. -Percocet ordered for pain. -GI cocktail also ordered. -Await results.    Maryann Conners MSN, CNM 04/25/2022, 3:04 AM   Reassessment (5:00 AM) Vitals:   04/25/22 0242 04/25/22 0305 04/25/22 0316 04/25/22 0335  BP: (!) 143/82 (!) 140/79 138/86 (!) 143/85   04/25/22 0430 04/25/22 0446  BP: 129/71 121/62    -Results as above. -BP normotensive. -Patient reports pain has resolved. -Primary ob contacted regarding follow-up. -Dr. Sophronia Simas called and informed of patient status, evaluation, interventions, and results. : *Requests patient chart be sent via mychart for scheduling of follow up, including induction, accordingly. -Provider to bedside to discuss results. -Precautions reviewed and questions addressed. -Instructed to keep appt as scheduled unless contacted about earlier appt. -Encouraged to call primary office or return to MAU if symptoms worsen or with the onset of new symptoms. -Discharged to home in improved condition.  Maryann Conners MSN, CNM Advanced Practice Provider, [facility]

## 2022-04-25 NOTE — MAU Note (Signed)
.  Cindy Hughes is a 31 y.o. at [redacted]w[redacted]d here in MAU reporting: that her OB has been monitoring her for Pre-E and she started having RUQ pain that radiates to her back around 0030 this morning. Pain feels like someone is punching her and radiates to her back. Denies HA's, visual changes, or abnormal swelling. Denies VB or LOF. Reports that she has felt less FM since the pain first started at [facility]. LMP: N/A Onset of complaint: today Pain score: 7/10 Vitals:   04/25/22 0242  BP: (!) 143/82  Pulse: 89  Resp: 16  Temp: 98.1 F (36.7 C)  SpO2: 100%     FHT:132 Lab orders placed from triage:  UA

## 2022-04-25 NOTE — MAU Note (Signed)
Pt continues to report + fetal movement. States po medication was effective with a pain score of 0. External fetal and labor monitors applied.

## 2022-04-27 ENCOUNTER — Other Ambulatory Visit: Payer: Self-pay | Admitting: Obstetrics and Gynecology

## 2022-04-27 ENCOUNTER — Inpatient Hospital Stay (HOSPITAL_COMMUNITY)
Admission: AD | Admit: 2022-04-27 | Discharge: 2022-04-29 | DRG: 807 | Disposition: A | Discharge status: Home / Self Care | Payer: Medicaid Other | Attending: Obstetrics & Gynecology | Admitting: Obstetrics & Gynecology

## 2022-04-27 ENCOUNTER — Ambulatory Visit: Payer: Medicaid Other | Admitting: *Deleted

## 2022-04-27 ENCOUNTER — Ambulatory Visit (HOSPITAL_BASED_OUTPATIENT_CLINIC_OR_DEPARTMENT_OTHER): Payer: Medicaid Other

## 2022-04-27 VITALS — BP 137/70 | HR 85

## 2022-04-27 DIAGNOSIS — Z363 Encounter for antenatal screening for malformations: Secondary | ICD-10-CM | POA: Insufficient documentation

## 2022-04-27 DIAGNOSIS — Z3A38 38 weeks gestation of pregnancy: Secondary | ICD-10-CM | POA: Diagnosis not present

## 2022-04-27 DIAGNOSIS — O99213 Obesity complicating pregnancy, third trimester: Secondary | ICD-10-CM

## 2022-04-27 DIAGNOSIS — O99214 Obesity complicating childbirth: Secondary | ICD-10-CM | POA: Diagnosis present

## 2022-04-27 DIAGNOSIS — O4103X0 Oligohydramnios, third trimester, not applicable or unspecified: Secondary | ICD-10-CM | POA: Diagnosis present

## 2022-04-27 DIAGNOSIS — O4100X0 Oligohydramnios, unspecified trimester, not applicable or unspecified: Secondary | ICD-10-CM | POA: Diagnosis present

## 2022-04-27 LAB — CBC WITH DIFFERENTIAL/PLATELET
Abs Immature Granulocytes: 0.07 10*3/uL (ref 0.00–0.07)
Basophils Absolute: 0 10*3/uL (ref 0.0–0.1)
Basophils Relative: 0 %
Eosinophils Absolute: 0.1 10*3/uL (ref 0.0–0.5)
Eosinophils Relative: 1 %
HCT: 26.5 % — ABNORMAL LOW (ref 36.0–46.0)
Hemoglobin: 8.6 g/dL — ABNORMAL LOW (ref 12.0–15.0)
Immature Granulocytes: 1 %
Lymphocytes Relative: 26 %
Lymphs Abs: 2.5 10*3/uL (ref 0.7–4.0)
MCH: 27 pg (ref 26.0–34.0)
MCHC: 32.5 g/dL (ref 30.0–36.0)
MCV: 83.3 fL (ref 80.0–100.0)
Monocytes Absolute: 0.7 10*3/uL (ref 0.1–1.0)
Monocytes Relative: 7 %
Neutro Abs: 6.3 10*3/uL (ref 1.7–7.7)
Neutrophils Relative %: 65 %
Platelets: 411 10*3/uL — ABNORMAL HIGH (ref 150–400)
RBC: 3.18 MIL/uL — ABNORMAL LOW (ref 3.87–5.11)
RDW: 15.5 % (ref 11.5–15.5)
WBC: 9.7 10*3/uL (ref 4.0–10.5)
nRBC: 0 % (ref 0.0–0.2)

## 2022-04-27 LAB — LACTATE DEHYDROGENASE: LDH: 123 U/L (ref 98–192)

## 2022-04-27 LAB — COMPREHENSIVE METABOLIC PANEL
ALT: 10 U/L (ref 0–44)
AST: 12 U/L — ABNORMAL LOW (ref 15–41)
Albumin: 2.4 g/dL — ABNORMAL LOW (ref 3.5–5.0)
Alkaline Phosphatase: 92 U/L (ref 38–126)
Anion gap: 6 (ref 5–15)
BUN: 6 mg/dL (ref 6–20)
CO2: 23 mmol/L (ref 22–32)
Calcium: 8.3 mg/dL — ABNORMAL LOW (ref 8.9–10.3)
Chloride: 108 mmol/L (ref 98–111)
Creatinine, Ser: 0.55 mg/dL (ref 0.44–1.00)
GFR, Estimated: >60 mL/min (ref >60)
Glucose, Bld: 105 mg/dL — ABNORMAL HIGH (ref 70–99)
Potassium: 2.8 mmol/L — ABNORMAL LOW (ref 3.5–5.1)
Sodium: 137 mmol/L (ref 135–145)
Total Bilirubin: 0.2 mg/dL — ABNORMAL LOW (ref 0.3–1.2)
Total Protein: 6.1 g/dL — ABNORMAL LOW (ref 6.5–8.1)

## 2022-04-27 MED ORDER — ONDANSETRON HCL 4 MG/2ML IJ SOLN: 4.0000 mg | Freq: Four times a day (QID) | INTRAMUSCULAR | Status: DC | PRN

## 2022-04-27 MED ORDER — HYDROXYZINE HCL 50 MG PO TABS: 50.0000 mg | ORAL_TABLET | Freq: Four times a day (QID) | ORAL | Status: DC | PRN

## 2022-04-27 MED ORDER — OXYCODONE-ACETAMINOPHEN 5-325 MG PO TABS: 2.0000 | ORAL_TABLET | ORAL | Status: DC | PRN

## 2022-04-27 MED ORDER — FENTANYL CITRATE (PF) 100 MCG/2ML IJ SOLN
50.0000 ug | INTRAMUSCULAR | Status: DC | PRN
  Administered 2022-04-28 (×2): 50 ug via INTRAVENOUS
  Filled 2022-04-27 (×2): qty 2

## 2022-04-27 MED ORDER — LIDOCAINE HCL (PF) 1 % IJ SOLN: 30.0000 mL | INTRAMUSCULAR | Status: DC | PRN

## 2022-04-27 MED ORDER — ACETAMINOPHEN 325 MG PO TABS: 650.0000 mg | ORAL_TABLET | ORAL | Status: DC | PRN

## 2022-04-27 MED ORDER — OXYTOCIN-SODIUM CHLORIDE 30-0.9 UT/500ML-% IV SOLN
1.0000 m[IU]/min | INTRAVENOUS | Status: DC
  Administered 2022-04-28: 1 m[IU]/min via INTRAVENOUS
  Filled 2022-04-27: qty 500

## 2022-04-27 MED ORDER — TERBUTALINE SULFATE 1 MG/ML IJ SOLN: 0.2500 mg | Freq: Once | INTRAMUSCULAR | Status: DC | PRN

## 2022-04-27 MED ORDER — SOD CITRATE-CITRIC ACID 500-334 MG/5ML PO SOLN: 30.0000 mL | ORAL | Status: DC | PRN

## 2022-04-27 MED ORDER — OXYTOCIN BOLUS FROM INFUSION
333.0000 mL | Freq: Once | INTRAVENOUS | Status: AC
  Administered 2022-04-28: 333 mL via INTRAVENOUS

## 2022-04-27 MED ORDER — LACTATED RINGERS IV SOLN: INTRAVENOUS | Status: DC

## 2022-04-27 MED ORDER — OXYCODONE-ACETAMINOPHEN 5-325 MG PO TABS: 1.0000 | ORAL_TABLET | ORAL | Status: DC | PRN

## 2022-04-27 MED ORDER — OXYTOCIN-SODIUM CHLORIDE 30-0.9 UT/500ML-% IV SOLN: 1.0000 m[IU]/min | INTRAVENOUS | Status: DC

## 2022-04-27 MED ORDER — MISOPROSTOL 25 MCG QUARTER TABLET
25.0000 ug | ORAL_TABLET | ORAL | Status: DC
  Administered 2022-04-27: 25 ug via VAGINAL
  Filled 2022-04-27: qty 1

## 2022-04-27 MED ORDER — LACTATED RINGERS IV SOLN: 500.0000 mL | INTRAVENOUS | Status: DC | PRN

## 2022-04-27 MED ORDER — OXYTOCIN-SODIUM CHLORIDE 30-0.9 UT/500ML-% IV SOLN
2.5000 [IU]/h | INTRAVENOUS | Status: DC
  Administered 2022-04-28: 2.5 [IU]/h via INTRAVENOUS

## 2022-04-28 ENCOUNTER — Other Ambulatory Visit: Payer: Self-pay | Admitting: Obstetrics & Gynecology

## 2022-04-28 ENCOUNTER — Other Ambulatory Visit: Payer: Self-pay

## 2022-04-28 ENCOUNTER — Encounter (HOSPITAL_COMMUNITY): Payer: Self-pay | Admitting: Obstetrics and Gynecology

## 2022-04-28 LAB — CBC
HCT: 27.1 % — ABNORMAL LOW (ref 36.0–46.0)
Hemoglobin: 8.8 g/dL — ABNORMAL LOW (ref 12.0–15.0)
MCH: 27.1 pg (ref 26.0–34.0)
MCHC: 32.5 g/dL (ref 30.0–36.0)
MCV: 83.4 fL (ref 80.0–100.0)
Platelets: 381 10*3/uL (ref 150–400)
RBC: 3.25 MIL/uL — ABNORMAL LOW (ref 3.87–5.11)
RDW: 15.6 % — ABNORMAL HIGH (ref 11.5–15.5)
WBC: 10.3 10*3/uL (ref 4.0–10.5)
nRBC: 0 % (ref 0.0–0.2)

## 2022-04-28 LAB — TYPE AND SCREEN
ABO/RH(D): A POS
Antibody Screen: NEGATIVE

## 2022-04-28 LAB — RPR: RPR Ser Ql: NONREACTIVE

## 2022-04-28 MED ORDER — EPHEDRINE 5 MG/ML INJ: 10.0000 mg | INTRAVENOUS | Status: DC | PRN

## 2022-04-28 MED ORDER — WITCH HAZEL-GLYCERIN EX PADS: 1.0000 | MEDICATED_PAD | CUTANEOUS | Status: DC | PRN

## 2022-04-28 MED ORDER — LACTATED RINGERS IV SOLN: 500.0000 mL | Freq: Once | INTRAVENOUS | Status: DC

## 2022-04-28 MED ORDER — ACETAMINOPHEN 325 MG PO TABS
650.0000 mg | ORAL_TABLET | ORAL | Status: DC | PRN
  Administered 2022-04-28 (×2): 650 mg via ORAL
  Filled 2022-04-28 (×2): qty 2

## 2022-04-28 MED ORDER — IBUPROFEN 600 MG PO TABS
600.0000 mg | ORAL_TABLET | Freq: Four times a day (QID) | ORAL | Status: DC
  Administered 2022-04-28 – 2022-04-29 (×5): 600 mg via ORAL
  Filled 2022-04-28 (×5): qty 1

## 2022-04-28 MED ORDER — PHENYLEPHRINE 80 MCG/ML (10ML) SYRINGE FOR IV PUSH (FOR BLOOD PRESSURE SUPPORT)
80.0000 ug | PREFILLED_SYRINGE | INTRAVENOUS | Status: DC | PRN
  Filled 2022-04-28: qty 10

## 2022-04-28 MED ORDER — COCONUT OIL OIL: 1.0000 | TOPICAL_OIL | Status: DC | PRN

## 2022-04-28 MED ORDER — ONDANSETRON HCL 4 MG PO TABS: 4.0000 mg | ORAL_TABLET | ORAL | Status: DC | PRN

## 2022-04-28 MED ORDER — ZOLPIDEM TARTRATE 5 MG PO TABS: 5.0000 mg | ORAL_TABLET | Freq: Every evening | ORAL | Status: DC | PRN

## 2022-04-28 MED ORDER — SENNOSIDES-DOCUSATE SODIUM 8.6-50 MG PO TABS
2.0000 | ORAL_TABLET | Freq: Every day | ORAL | Status: DC
  Administered 2022-04-29: 2 via ORAL
  Filled 2022-04-28: qty 2

## 2022-04-28 MED ORDER — POTASSIUM CHLORIDE CRYS ER 20 MEQ PO TBCR
20.0000 meq | EXTENDED_RELEASE_TABLET | Freq: Two times a day (BID) | ORAL | Status: DC
  Administered 2022-04-28 – 2022-04-29 (×3): 20 meq via ORAL
  Filled 2022-04-28 (×7): qty 1

## 2022-04-28 MED ORDER — ONDANSETRON HCL 4 MG/2ML IJ SOLN: 4.0000 mg | INTRAMUSCULAR | Status: DC | PRN

## 2022-04-28 MED ORDER — DIPHENHYDRAMINE HCL 50 MG/ML IJ SOLN: 12.5000 mg | INTRAMUSCULAR | Status: DC | PRN

## 2022-04-28 MED ORDER — PRENATAL MULTIVITAMIN CH
1.0000 | ORAL_TABLET | Freq: Every day | ORAL | Status: DC
  Administered 2022-04-28 – 2022-04-29 (×2): 1 via ORAL
  Filled 2022-04-28 (×2): qty 1

## 2022-04-28 MED ORDER — SIMETHICONE 80 MG PO CHEW: 80.0000 mg | CHEWABLE_TABLET | ORAL | Status: DC | PRN

## 2022-04-28 MED ORDER — TETANUS-DIPHTH-ACELL PERTUSSIS 5-2.5-18.5 LF-MCG/0.5 IM SUSY: 0.5000 mL | PREFILLED_SYRINGE | Freq: Once | INTRAMUSCULAR | Status: DC

## 2022-04-28 MED ORDER — BENZOCAINE-MENTHOL 20-0.5 % EX AERO
1.0000 | INHALATION_SPRAY | CUTANEOUS | Status: DC | PRN
  Administered 2022-04-28: 1 via TOPICAL
  Filled 2022-04-28: qty 56

## 2022-04-28 MED ORDER — DIBUCAINE (PERIANAL) 1 % EX OINT: 1.0000 | TOPICAL_OINTMENT | CUTANEOUS | Status: DC | PRN

## 2022-04-28 MED ORDER — FENTANYL-BUPIVACAINE-NACL 0.5-0.125-0.9 MG/250ML-% EP SOLN
12.0000 mL/h | EPIDURAL | Status: DC | PRN
  Filled 2022-04-28: qty 250

## 2022-04-28 MED ORDER — DIPHENHYDRAMINE HCL 25 MG PO CAPS: 25.0000 mg | ORAL_CAPSULE | Freq: Four times a day (QID) | ORAL | Status: DC | PRN

## 2022-04-28 MED ORDER — POTASSIUM CHLORIDE 10 MEQ/100ML IV SOLN
10.0000 meq | INTRAVENOUS | Status: DC
  Administered 2022-04-28: 10 meq via INTRAVENOUS
  Filled 2022-04-28 (×6): qty 100

## 2022-04-28 MED ORDER — PHENYLEPHRINE 80 MCG/ML (10ML) SYRINGE FOR IV PUSH (FOR BLOOD PRESSURE SUPPORT): 80.0000 ug | PREFILLED_SYRINGE | INTRAVENOUS | Status: DC | PRN

## 2022-04-28 NOTE — H&P (Signed)
HPI: 31 y.o. [id number] @ [redacted]w[redacted]d estimated gestational age (as dated by 11 week ultrasound) presents for induction of labor due to oligohydramnios as recommended by MFM. Patient was admitted directly overnight. Considering epidural but undecided at this time.  Leakage of fluid:  No Vaginal bleeding:  No Contractions:  Yes - after Cytotec administration Fetal movement:  Yes  Prenatal care has been provided by [facility].  ROS:  Denies fevers, chills, chest pain, visual changes, SOB, RUQ/epigastric pain, N/V, dysuria, hematuria, or sudden onset/worsening bilateral LE or facial edema.  Pregnancy complicated by: Obesity (BMI 49) Vitamin D Deficiency   Prenatal Transfer Tool  Maternal Diabetes: No Genetic Screening: Normal: Low risk Panorama Maternal Ultrasounds/Referrals: Other:Oligohydramnios Fetal Ultrasounds or other Referrals:  Referred to Materal Fetal Medicine  Maternal Substance Abuse:  No Significant Maternal Medications:  None Significant Maternal Lab Results: Group B Strep negative   Prenatal Labs Blood type:  A Positive Antibody screen:  Negative CBC:  H/H 8.6/26.5 Rubella: Immune RPR:  Non-reactive Hep B:  Negative Hep C:  Negative HIV:  Negative GC/CT:  Negative Glucola:  83 (wnl)  OBHx:  OB History     Gravida  5   Para  2   Term  2   Preterm      AB  2   Living  2      SAB  1   IAB  1   Ectopic      Multiple  0   Live Births  2          PMHx:  See above Meds:  PNV Allergy:  No Known Allergies SurgHx: D&C in 2014 SocHx:   Denies Tobacco, ETOH, illicit drugs  O: BP 123456   Pulse 86   Temp 98.1 F (36.7 C) (Oral)   Resp 16   Ht 5\' 2"  (1.575 m)   Wt 123.2 kg   BMI 49.69 kg/m  Gen. AAOx3, NAD CV.  RRR  Resp. CTAB, no wheezes/rales/rhonchi Abd. Gravid, soft, non-tender throughout, no rebound/guarding Extr.  No bilateral LE edema, no calf tenderness bilaterally SVE: 1/40/-3 at 2245 by primary RN   Last [place]:   ----------------------------------------------------------------------  OBSTETRICS REPORT                       (Signed Final 04/27/2022 04:48 pm) ---------------------------------------------------------------------- Patient Info  ID #:       [id number]                          D.O.B.:  02-14-1991 (31 yrs)  Name:       Cindy Hughes [facility]           Visit Date: 04/27/2022 01:42 pm ---------------------------------------------------------------------- Performed By  Attending:        Sander Nephew      Ref. Address:     [place]                    MD                                                             [place]  [address]                                                             [place]                                                             [place], [place]  Performed By:     Eveline Keto         Location:         [facility]                    RD[facility]                                     [facility]                                                             [place] [facility]                                                             [facility]  Referred By:      Waymon Amato MD ---------------------------------------------------------------------- Orders  #  Description                           Code        Ordered By  1  [place] MFM FETAL BPP WO NON               76819.01    Keller  2  [place] MFM OB FOLLOW UP                   82505.39    Bing Matter ----------------------------------------------------------------------  #  Order #                     Accession #                Episode #  1  [id number]                   [id number]  [id number]  2  [id number]                   [id number]                 [id number] ---------------------------------------------------------------------- Indications  [redacted] weeks  gestation of pregnancy                [id number]  Obesity complicating pregnancy, second         O99.212  trimester (BMI 46)  Short interval between pregancies, 2nd         O09.892  trimester  LR NIPS ---------------------------------------------------------------------- Vital Signs  BP:          137/70 ---------------------------------------------------------------------- Fetal Evaluation  Num Of Fetuses:         1  Fetal Heart Rate(bpm):  146  Cardiac Activity:       Observed  Presentation:           Cephalic  Placenta:               Right lateral  P. Cord Insertion:      Previously Visualized  Amniotic Fluid  AFI FV:      Oligohydramnios  AFI Sum(cm)     %Tile       Largest Pocket(cm)  2.64            < 3         2.64  RUQ(cm)       RLQ(cm)       LUQ(cm)        LLQ(cm)  2.64          0             0              0 ---------------------------------------------------------------------- Biophysical Evaluation  Amniotic F.V:   Within normal limits       F. Tone:        Observed  F. Movement:    Observed                   Score:          8/8  F. Breathing:   Observed ---------------------------------------------------------------------- Biometry  BPD:     85.85  mm     G. Age:  34w 4d        2.1  %    CI:        75.31   %    70 - 86                                                          FL/HC:      24.0   %    20.9 - 22.7  HC:    313.76   mm     G. Age:  35w 1d        < 1  %    HC/AC:      0.93        0.92 - 1.05  AC:    336.25   mm     G. Age:  37w 4d         47  %    FL/BPD:     87.6   %    71 - 87  FL:  75.24  mm     G. Age:  38w 3d         88  %    FL/AC:      22.4   %    20 - 24  LV:       2.78  mm  Est. FW:    3125  gm    6 lb 14 oz      34  % ---------------------------------------------------------------------- OB History  Gravidity:    5         Term:   2         SAB:   1  TOP:          1        Living:   2 ---------------------------------------------------------------------- Gestational Age  U/S Today:     36w 3d                                        EDD:   05/22/22  Best:          38w 2d     Det. ByLoman Chroman         EDD:   05/09/22                                      (10/20/21) ---------------------------------------------------------------------- Anatomy  Cranium:               Appears normal         Stomach:                Appears normal, left                                                                        sided  Cavum:                 Appears normal         Kidneys:                Appear normal  Ventricles:            Appears normal         Bladder:                Appears normal  Diaphragm:             Appears normal ---------------------------------------------------------------------- Cervix Uterus Adnexa  Cervix  Not visualized (advanced GA >24wks)  Uterus  No abnormality visualized.  Right Ovary  Not visualized.  Left Ovary  Not visualized. ---------------------------------------------------------------------- Impression  Follow up growth due to elevated BMI  Normal interval growth with measurements consistent with  dates  Good fetal movement and oligohydramnios.  Biophysical profile 8/8  I discussed today's visit with Ms. Coomes and recommended  delivery given term gestation with oligohydramnios.  I discussed with Dr. Mancel Bale who is on call and she will  contact her to schedule delivery.  She reports good fetal movement. ---------------------------------------------------------------------- Recommendations  Delivery at first available. ----------------------------------------------------------------------  Sander Nephew, MD Electronically Signed Final Report   04/27/2022 04:48 pm ----------------------------------------------------------------------    FHT: 145 baseline, moderate variability, + accels,  - decels Toco: not graphing  well, irritability   Labs: see orders  A/P:  31 y.o. [id number] @ [redacted]w[redacted]d who presents for induction of labor for oligohydramnios.  IOL - Oligohydramnios - Admit to L&D - Admit labs (CBC, T&S, RPR) - CEFM/Toco - FWB:  Category I - Diet:  Clear liquids - IVF:  LR at 125cc/hour - VTE Prophylaxis:  SCDs - GBS Status:  Negative - Presentation:  Cephalic by sutures and [place] today - Pain control:  Per patient request - Induction method:  Cytotec for cervical ripening - Anticipate SVD  Proteinuria - Previous PCR 0.3 without other signs/symptoms of preeclampsia - Preeclampsia labs unremarkable  Hypokalemia - Potassium 2.8 -- IV replacement ordered 42mEq x 6 doses     Drema Dallas, DO

## 2022-04-28 NOTE — Plan of Care (Signed)
Pt. Admitted to Rm 510 at 0730.  VSS with BP slightly elevated; will recheck.  Orders released, pt and infant assessed.  Call light within reach, pt. Educated on safety for her and infant.

## 2022-04-28 NOTE — Progress Notes (Signed)
RN in another room with provider; unable to step away, could not call for help to check patient fundus. VD RN

## 2022-04-29 LAB — CBC WITH DIFFERENTIAL/PLATELET
Abs Immature Granulocytes: 0.05 10*3/uL (ref 0.00–0.07)
Basophils Absolute: 0 10*3/uL (ref 0.0–0.1)
Basophils Relative: 0 %
Eosinophils Absolute: 0.1 10*3/uL (ref 0.0–0.5)
Eosinophils Relative: 2 %
HCT: 25.8 % — ABNORMAL LOW (ref 36.0–46.0)
Hemoglobin: 8.3 g/dL — ABNORMAL LOW (ref 12.0–15.0)
Immature Granulocytes: 1 %
Lymphocytes Relative: 37 %
Lymphs Abs: 2.9 10*3/uL (ref 0.7–4.0)
MCH: 27.1 pg (ref 26.0–34.0)
MCHC: 32.2 g/dL (ref 30.0–36.0)
MCV: 84.3 fL (ref 80.0–100.0)
Monocytes Absolute: 0.8 10*3/uL (ref 0.1–1.0)
Monocytes Relative: 10 %
Neutro Abs: 4 10*3/uL (ref 1.7–7.7)
Neutrophils Relative %: 50 %
Platelets: 375 10*3/uL (ref 150–400)
RBC: 3.06 MIL/uL — ABNORMAL LOW (ref 3.87–5.11)
RDW: 15.7 % — ABNORMAL HIGH (ref 11.5–15.5)
WBC: 7.9 10*3/uL (ref 4.0–10.5)
nRBC: 0 % (ref 0.0–0.2)

## 2022-04-29 LAB — SURGICAL PATHOLOGY

## 2022-04-29 LAB — POTASSIUM: Potassium: 3.7 mmol/L (ref 3.5–5.1)

## 2022-04-29 MED ORDER — ACETAMINOPHEN 325 MG PO TABS: 650.0000 mg | ORAL_TABLET | ORAL | Status: AC | PRN

## 2022-04-29 MED ORDER — IBUPROFEN 600 MG PO TABS: 600.0000 mg | ORAL_TABLET | Freq: Four times a day (QID) | ORAL | 0 refills | Status: AC

## 2022-04-29 NOTE — Discharge Summary (Signed)
Postpartum Discharge Summary  Date of Service updated 04/29/22    Patient Name: Cindy Hughes DOB: 15-Aug-1990 MRN: [id number]  Date of admission: 04/27/2022 Delivery date:04/28/2022  Delivering provider: Drema Dallas  Date of discharge: 04/29/2022  Admitting diagnosis: Oligohydramnios [O41.00X0] Intrauterine pregnancy: [redacted]w[redacted]d    Secondary diagnosis:  Principal Problem:   Oligohydramnios  Additional problems: none    Discharge diagnosis: Term Pregnancy Delivered                                              Post partum procedures: none Augmentation: Pitocin and Cytotec Complications: None  HPI: 31y.o. [id number]@ 322w3dstimated gestational age (as dated by 11 week ultrasound) presents for induction of labor due to oligohydramnios as recommended by MFM. Hospital course: Induction of Labor With Vaginal Delivery   [age].o. yo [id number] 3898w3ds admitted to the hospital 04/27/2022 for induction of labor.  Indication for induction:  oligohydramnios .  Patient had an uncomplicated labor course as follows: Membrane Rupture Time/Date:  , delivered en caul  Delivery Method:Vaginal, Spontaneous  Episiotomy: None  Lacerations:  None  Details of delivery can be found in separate delivery note.  Patient had a routine postpartum course. Patient is discharged home 04/29/22.  Newborn Data: Birth date:04/28/2022  Birth time:4:37 AM  Gender:Female  Living status:Living  Apgars:8 ,9  Weight:2610 g   Magnesium Sulfate received: No BMZ received: No Rhophylac:N/A MMR:N/A Transfusion:No  Physical exam  Vitals:   04/28/22 1309 04/28/22 1705 04/28/22 2103 04/29/22 0547  BP: 134/77 135/87 119/78 132/77  Pulse: 82 69 81 71  Resp: '18 18 16 16  ' Temp: 97.9 F (36.6 C) 98.4 F (36.9 C) 98.1 F (36.7 C) 98 F (36.7 C)  TempSrc: Oral Oral Oral Oral  SpO2: 100%  100% 100%  Weight:      Height:       General: alert, cooperative, and no distress Lochia: appropriate Uterine  Fundus: firm Incision: N/A DVT Evaluation: No evidence of DVT seen on physical exam. No cords or calf tenderness. No significant calf/ankle edema. Labs: Lab Results  Component Value Date   WBC 7.9 04/29/2022   HGB 8.3 (L) 04/29/2022   HCT 25.8 (L) 04/29/2022   MCV 84.3 04/29/2022   PLT 375 04/29/2022      Latest Ref Rng & Units 04/29/2022    4:35 AM  CMP  Potassium 3.5 - 5.1 mmol/L 3.7    Edinburgh Score:    04/28/2022    9:03 PM  Edinburgh Postnatal Depression Scale Screening Tool  I have been able to laugh and see the funny side of things. 0  I have looked forward with enjoyment to things. 0  I have blamed myself unnecessarily when things went wrong. 1  I have been anxious or worried for no good reason. 1  I have felt scared or panicky for no good reason. 0  Things have been getting on top of me. 0  I have been so unhappy that I have had difficulty sleeping. 0  I have felt sad or miserable. 1  I have been so unhappy that I have been crying. 0  The thought of harming myself has occurred to me. 0  Edinburgh Postnatal Depression Scale Total 3      After visit meds:  Allergies as of 04/29/2022   No Known  Allergies      Medication List     TAKE these medications    acetaminophen 325 MG tablet Commonly known as: Tylenol Take 2 tablets (650 mg total) by mouth every 4 (four) hours as needed (for pain scale < 4).   ibuprofen 600 MG tablet Commonly known as: ADVIL Take 1 tablet (600 mg total) by mouth every 6 (six) hours.   prenatal multivitamin Tabs tablet Take 2 tablets by mouth daily at 12 noon.         Discharge home in stable condition Infant Feeding: Bottle and Breast Infant Disposition:home with mother Discharge instruction: per After Visit Summary and Postpartum booklet. Activity: Advance as tolerated. Pelvic rest for 6 weeks.  Diet: routine diet Anticipated Birth Control: Unsure Postpartum Appointment:6 weeks Additional Postpartum F/U:   none Future Appointments:No future appointments. Follow up Visit:  [facility] Follow up in 6 week(s).   Specialty: Obstetrics and Gynecology Contact information: [address] [place]  [address] [phone]                    04/29/2022 Arrie Eastern, CNM

## 2022-05-07 ENCOUNTER — Telehealth (HOSPITAL_COMMUNITY): Payer: Self-pay | Admitting: *Deleted

## 2022-05-07 NOTE — Telephone Encounter (Signed)
Patient voiced no questions or concerns regarding her health at this time. EPDS= 1 Patient voiced no questions or concerns regarding infant at this time. Patient reports infant sleeps in a crib on her back. RN reviewed ABCs of safe sleep. Patient verbalized understanding. Patient requested RN email information on hospital's virtual Baby and Me class. Email sent. Erline Levine, RN, 05/07/22, [phone]

## 2023-03-08 IMAGING — US US MFM OB FOLLOW-UP
1 series · 13 of 28 positions shown · non-contrast
Comparison: none

[Series 1: us mfm ob follow-up · 115 acquisitions, 13 frames shown]
[im 5/115]
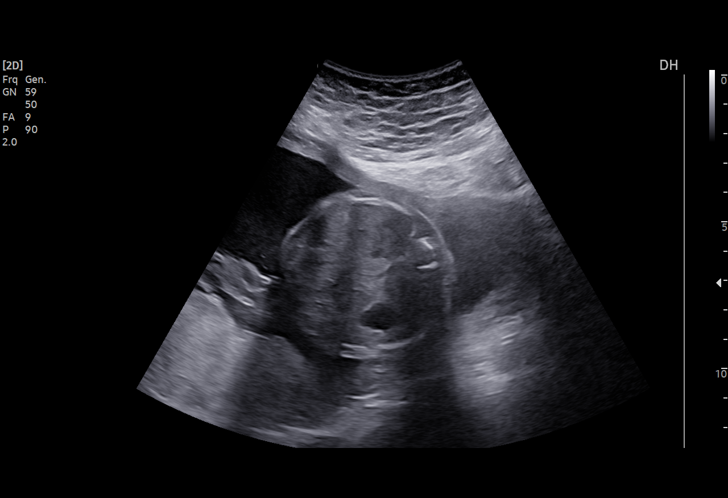
[im 13/115]
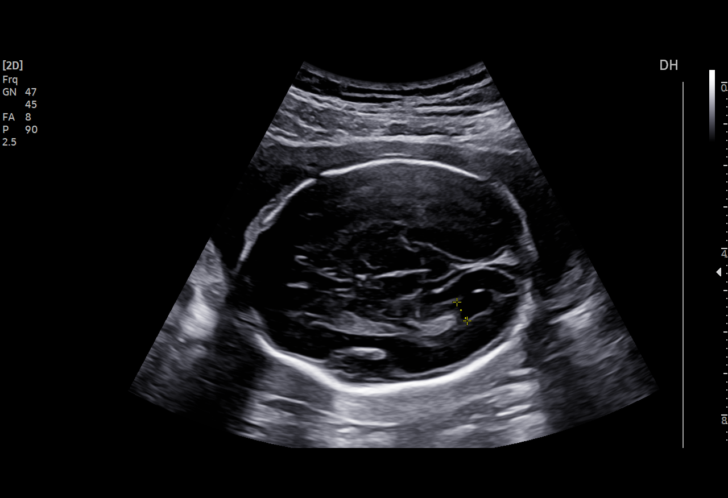
[im 22/115]
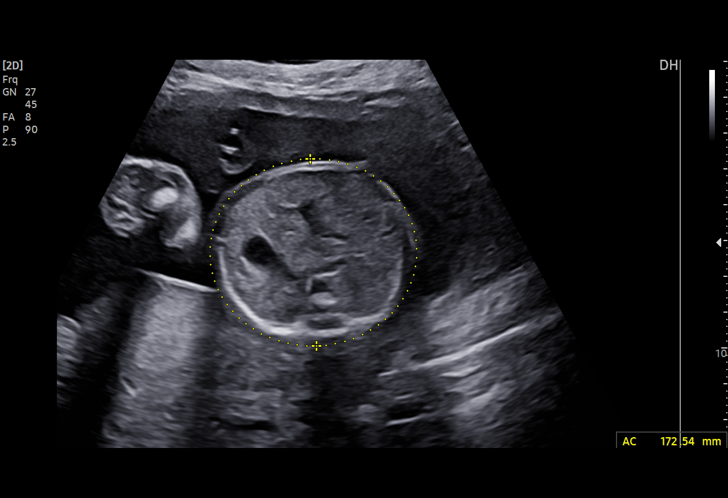
[im 30/115]
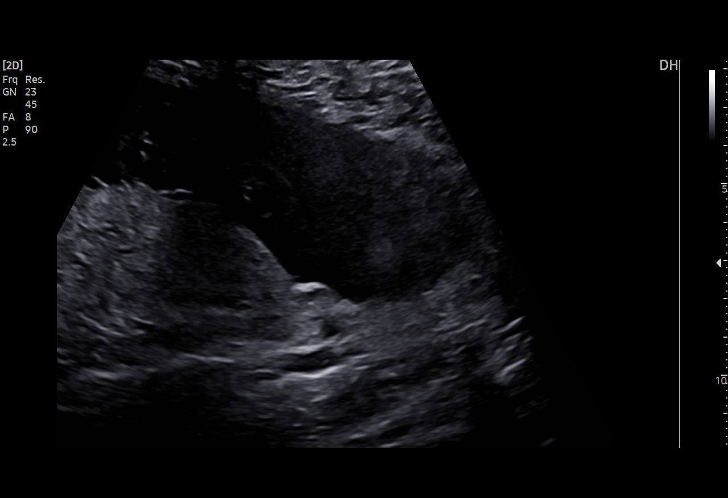
[im 39/115]
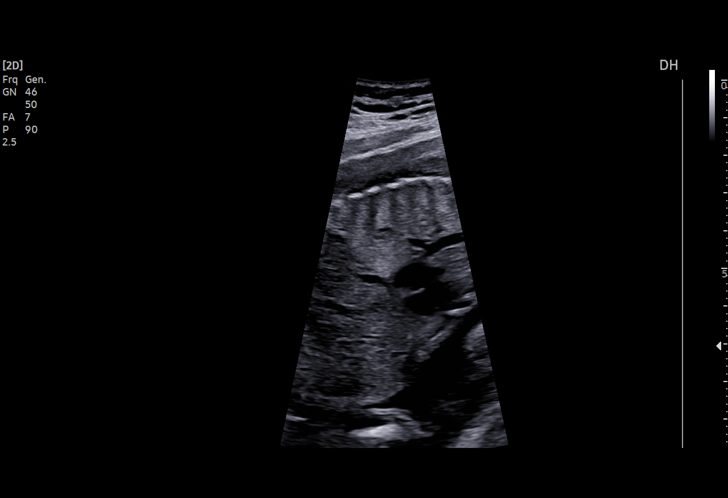
[im 47/115]
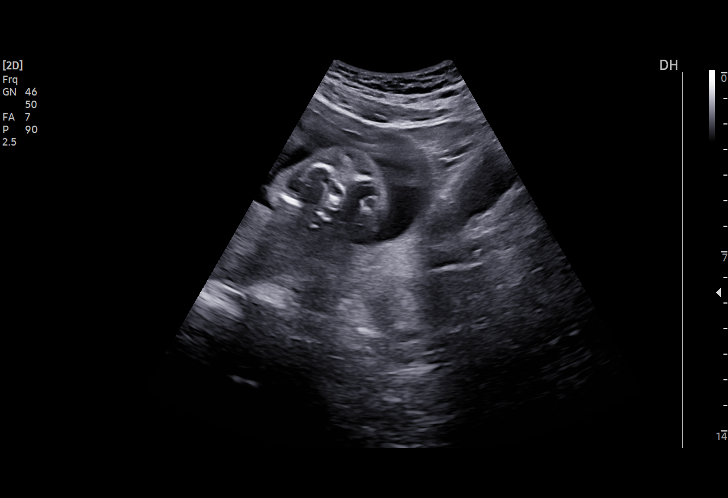
[im 60/115]
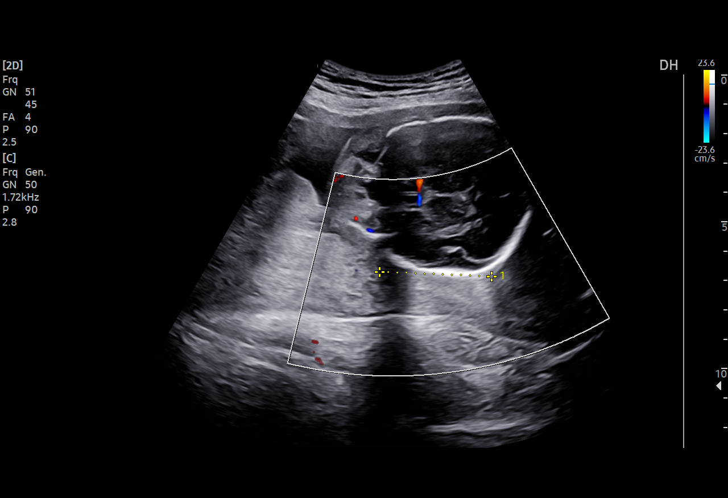
[im 68/115]
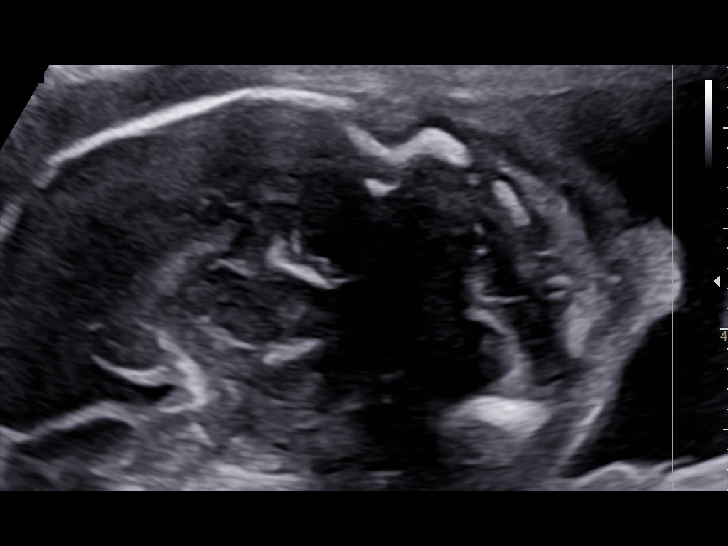
[im 77/115]
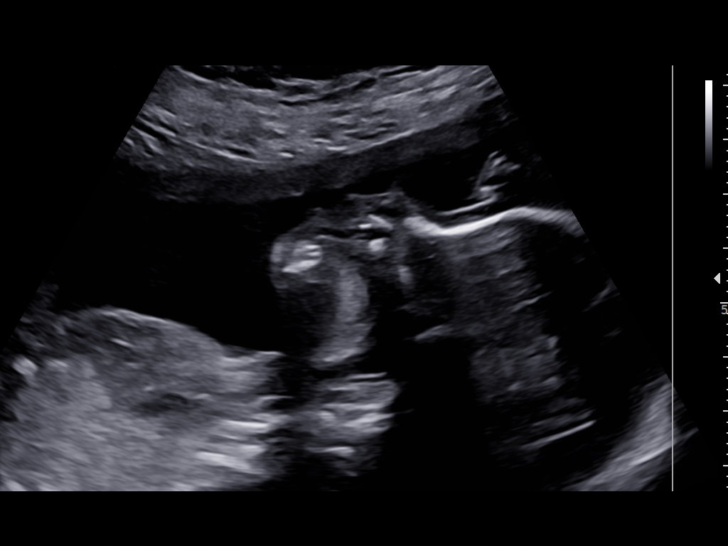
[im 85/115]
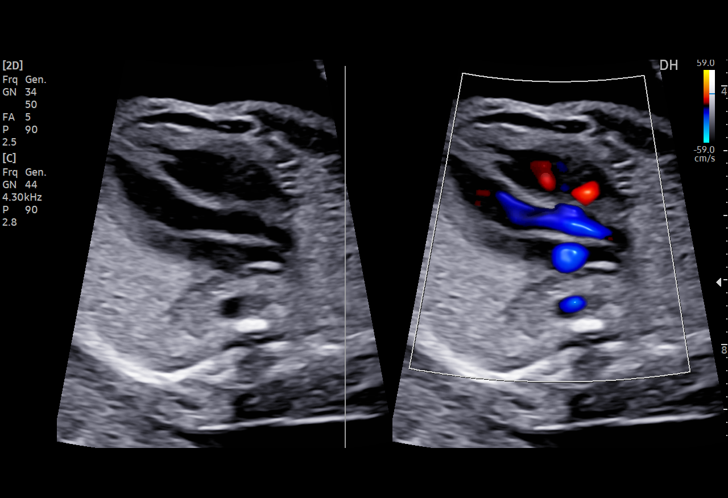
[im 93/115]
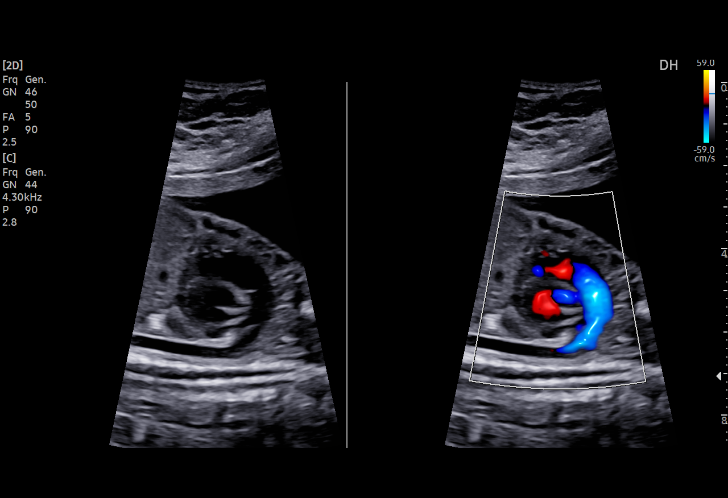
[im 102/115]
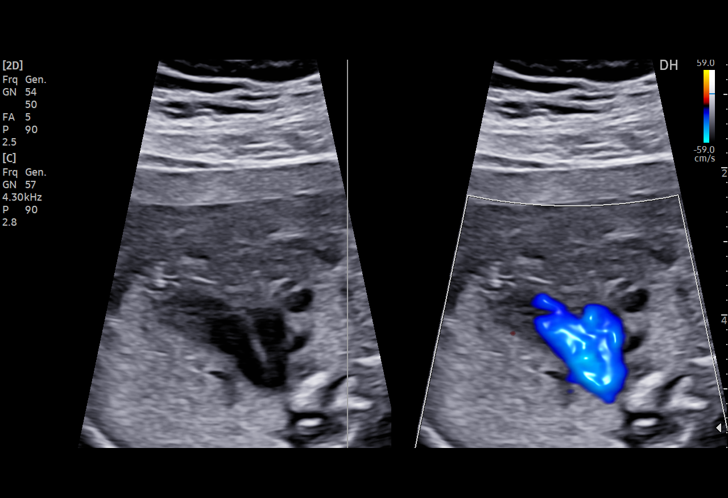
[im 110/115]
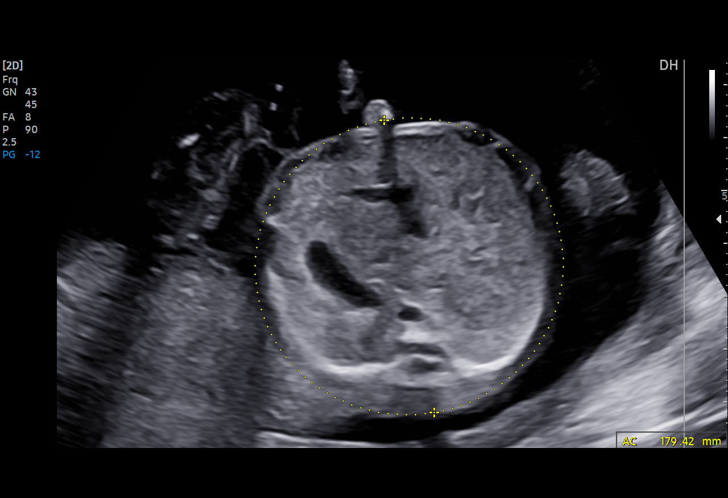

[13 of 28 positions shown; findings below may reference images not displayed]

OB

Indications

 23 weeks gestation of pregnancy
 Obesity complicating pregnancy, second
 trimester (BMI 46)
 Short interval between pregancies, 2nd
 trimester
 LR NIPS
 Antenatal follow-up for nonvisualized fetal
 anatomy
Fetal Evaluation

 Num Of Fetuses:         1
 Fetal Heart Rate(bpm):  155
 Cardiac Activity:       Observed
 Presentation:           Cephalic
 Placenta:               Right Posterior
 P. Cord Insertion:      Previously Visualized

 Amniotic Fluid
 AFI FV:      Within normal limits

                             Largest Pocket(cm)

Biometry
 BPD:     52.56  mm     G. Age:  22w 0d          7  %    CI:         71.1   %    70 - 86
                                                         FL/HC:      21.8   %    19.2 -
 HC:    198.58   mm     G. Age:  22w 0d        3.8  %    HC/AC:      1.10        1.05 -
 AC:      180.9  mm     G. Age:  23w 0d         30  %    FL/BPD:     82.2   %    71 - 87
 FL:       43.2  mm     G. Age:  24w 1d         67  %    FL/AC:      23.9   %    20 - 24
 CER:      22.3  mm     G. Age:  20w 6d         11  %
 LV:          5  mm
 CM:        5.3  mm

 Est. FW:     578  gm      1 lb 4 oz     41  %
OB History

 Gravidity:    5         Term:   2         SAB:   1
 TOP:          1        Living:  2
Gestational Age

 U/S Today:     22w 6d                                        EDD:   05/12/22
 Best:          23w 2d     Det. By:  Early Ultrasound         EDD:   05/09/22
                                     (10/20/21)
Anatomy

 Cranium:               Appears normal         LVOT:                   Appears normal
 Cavum:                 Appears normal         Aortic Arch:            Appears normal
 Ventricles:            Appears normal         Ductal Arch:            Appears normal
 Choroid Plexus:        Appears normal         Diaphragm:              Appears normal
 Cerebellum:            Appears normal         Stomach:                Appears normal, left
                                                                       sided
 Posterior Fossa:       Appears normal         Abdomen:                Previously seen
 Nuchal Fold:           Previously seen        Abdominal Wall:         Previously seen
 Face:                  Appears normal         Cord Vessels:           Previously seen
                        (orbits and profile)
 Lips:                  Appears normal         Kidneys:                Appear normal
 Palate:                Appears normal         Bladder:                Appears normal
 Thoracic:              Appears normal         Spine:                  Appears normal
 Heart:                 Appears normal         Upper Extremities:      Previously seen
                        (4CH, axis, and
                        situs)
 RVOT:                  Appears normal         Lower Extremities:      Previously seen

 Other:  Fetus appears to be female. VC, 3VV and 3VTV prev visualized.
         Heels/feet and open hands/5th digits, nasal bone, lenses, maxilla,
         mandible and falx prev visualized. Technically difficult due to maternal
         habitus
Cervix Uterus Adnexa

 Cervix
 Length:            3.5  cm.
 Normal appearance by transabdominal scan.

 Uterus
 Normal shape and size.
 Right Ovary
 Within normal limits.

 Left Ovary
 Within normal limits.

 Cul De Sac
 No free fluid seen.

 Adnexa
 No adnexal mass visualized.
Impression

 Follow up growth due to elevated BMI
 Normal interval growth with measurements consistent with
 dates
 Good fetal movement and amniotic fluid volume
Recommendations

 Follow up growth in 4-6 weeks
 Initiate weekly testing at 34 weeks.
